# Patient Record
Sex: Female | Born: 1980
Health system: Southern US, Community
[De-identification: ages and names within clinical notes are randomized; demographics above are authoritative.]

## PROBLEM LIST (undated history)

## (undated) DIAGNOSIS — O139 Gestational [pregnancy-induced] hypertension without significant proteinuria, unspecified trimester: Secondary | ICD-10-CM

## (undated) HISTORY — DX: Gestational (pregnancy-induced) hypertension without significant proteinuria, unspecified trimester: O13.9

## (undated) HISTORY — PX: NO PAST SURGERIES: SHX2092

---

## 2005-01-14 DIAGNOSIS — O1490 Unspecified pre-eclampsia, unspecified trimester: Secondary | ICD-10-CM

## 2016-10-23 NOTE — L&D Delivery Note (Signed)
36 y.o. [id number] at 5780w3d admitted for IOL for pre-e with severe features, A1GDM delivered a viable female infant at 1810 in cephalic, LOA position. Loose nuchal cord, easily reduced at the perineum. Right anterior shoulder delivered with ease. 60 sec delayed cord clamping. Cord clamped x2 and cut. Placenta delivered spontaneously intact, with 3VC. Fundus firm on exam with massage and pitocin. Good hemostasis noted.  Anesthesia: Epidural Laceration: 2nd degree perineal Suture: 3.0 Vicryl Good hemostasis noted. EBL: 350 cc  Mom and baby recovering in LDR.    Apgars: APGAR (1 MIN):   APGAR (5 MINS):   Weight: Pending skin to skin  Sponge and instrument count were correct x2. Placenta sent to L&D.  [place] Shirley, DO FM Resident PGY-1 05/28/2017 6:41 PM  OB FELLOW DELIVERY ATTESTATION  I was gloved and present for the delivery in its entirety, and I agree with the above resident's note.    Frederik PearJulie P Alicen Donalson, MD OB Fellow 6:47 PM

## 2016-11-10 LAB — OB RESULTS CONSOLE HGB/HCT, BLOOD
HCT: 39
Hemoglobin: 12.6

## 2016-11-10 LAB — OB RESULTS CONSOLE HEPATITIS B SURFACE ANTIGEN: Hepatitis B Surface Ag: NEGATIVE

## 2016-11-10 LAB — OB RESULTS CONSOLE RPR: RPR: NONREACTIVE

## 2016-11-10 LAB — OB RESULTS CONSOLE GC/CHLAMYDIA
Chlamydia: NEGATIVE
Gonorrhea: NEGATIVE

## 2016-11-10 LAB — OB RESULTS CONSOLE ANTIBODY SCREEN: Antibody Screen: NEGATIVE

## 2016-11-10 LAB — OB RESULTS CONSOLE RUBELLA ANTIBODY, IGM: Rubella: IMMUNE

## 2016-11-10 LAB — OB RESULTS CONSOLE PLATELET COUNT: Platelets: 246

## 2016-11-10 LAB — OB RESULTS CONSOLE HIV ANTIBODY (ROUTINE TESTING): HIV: NONREACTIVE

## 2016-11-10 LAB — OB RESULTS CONSOLE ABO/RH: RH Type: POSITIVE

## 2016-12-14 LAB — OB RESULTS CONSOLE HGB/HCT, BLOOD
HEMATOCRIT: 40
HEMOGLOBIN: 13.4

## 2016-12-14 LAB — OB RESULTS CONSOLE PLATELET COUNT: Platelets: 195

## 2017-03-15 ENCOUNTER — Encounter: Payer: Self-pay | Admitting: *Deleted

## 2017-04-09 ENCOUNTER — Encounter: Payer: Self-pay | Admitting: Obstetrics & Gynecology

## 2017-04-09 ENCOUNTER — Ambulatory Visit (INDEPENDENT_AMBULATORY_CARE_PROVIDER_SITE_OTHER): Payer: Self-pay | Admitting: Obstetrics & Gynecology

## 2017-04-09 VITALS — BP 123/80 | HR 92 | Ht 69.0 in | Wt 248.8 lb

## 2017-04-09 DIAGNOSIS — O09293 Supervision of pregnancy with other poor reproductive or obstetric history, third trimester: Secondary | ICD-10-CM

## 2017-04-09 DIAGNOSIS — Z23 Encounter for immunization: Secondary | ICD-10-CM

## 2017-04-09 DIAGNOSIS — Z3480 Encounter for supervision of other normal pregnancy, unspecified trimester: Secondary | ICD-10-CM | POA: Insufficient documentation

## 2017-04-09 DIAGNOSIS — Z3483 Encounter for supervision of other normal pregnancy, third trimester: Secondary | ICD-10-CM

## 2017-04-09 NOTE — Patient Instructions (Signed)
Third Trimester of Pregnancy The third trimester is from week 28 through week 40 (months 7 through 9). The third trimester is a time when the unborn baby (fetus) is growing rapidly. At the end of the ninth month, the fetus is about 20 inches in length and weighs 6-10 pounds. Body changes during your third trimester Your body will continue to go through many changes during pregnancy. The changes vary from woman to woman. During the third trimester:  Your weight will continue to increase. You can expect to gain 25-35 pounds (11-16 kg) by the end of the pregnancy.  You may begin to get stretch marks on your hips, abdomen, and breasts.  You may urinate more often because the fetus is moving lower into your pelvis and pressing on your bladder.  You may develop or continue to have heartburn. This is caused by increased hormones that slow down muscles in the digestive tract.  You may develop or continue to have constipation because increased hormones slow digestion and cause the muscles that push waste through your intestines to relax.  You may develop hemorrhoids. These are swollen veins (varicose veins) in the rectum that can itch or be painful.  You may develop swollen, bulging veins (varicose veins) in your legs.  You may have increased body aches in the pelvis, back, or thighs. This is due to weight gain and increased hormones that are relaxing your joints.  You may have changes in your hair. These can include thickening of your hair, rapid growth, and changes in texture. Some women also have hair loss during or after pregnancy, or hair that feels dry or thin. Your hair will most likely return to normal after your baby is born.  Your breasts will continue to grow and they will continue to become tender. A yellow fluid (colostrum) may leak from your breasts. This is the first milk you are producing for your baby.  Your belly button may stick out.  You may notice more swelling in your hands,  face, or ankles.  You may have increased tingling or numbness in your hands, arms, and legs. The skin on your belly may also feel numb.  You may feel short of breath because of your expanding uterus.  You may have more problems sleeping. This can be caused by the size of your belly, increased need to urinate, and an increase in your body's metabolism.  You may notice the fetus "dropping," or moving lower in your abdomen (lightening).  You may have increased vaginal discharge.  You may notice your joints feel loose and you may have pain around your pelvic bone.  What to expect at prenatal visits You will have prenatal exams every 2 weeks until week 36. Then you will have weekly prenatal exams. During a routine prenatal visit:  You will be weighed to make sure you and the baby are growing normally.  Your blood pressure will be taken.  Your abdomen will be measured to track your baby's growth.  The fetal heartbeat will be listened to.  Any test results from the previous visit will be discussed.  You may have a cervical check near your due date to see if your cervix has softened or thinned (effaced).  You will be tested for Group B streptococcus. This happens between 35 and 37 weeks.  Your health care provider may ask you:  What your birth plan is.  How you are feeling.  If you are feeling the baby move.  If you have had   any abnormal symptoms, such as leaking fluid, bleeding, severe headaches, or abdominal cramping.  If you are using any tobacco products, including cigarettes, chewing tobacco, and electronic cigarettes.  If you have any questions.  Other tests or screenings that may be performed during your third trimester include:  Blood tests that check for low iron levels (anemia).  Fetal testing to check the health, activity level, and growth of the fetus. Testing is done if you have certain medical conditions or if there are problems during the  pregnancy.  Nonstress test (NST). This test checks the health of your baby to make sure there are no signs of problems, such as the baby not getting enough oxygen. During this test, a belt is placed around your belly. The baby is made to move, and its heart rate is monitored during movement.  What is false labor? False labor is a condition in which you feel small, irregular tightenings of the muscles in the womb (contractions) that usually go away with rest, changing position, or drinking water. These are called Braxton Hicks contractions. Contractions may last for hours, days, or even weeks before true labor sets in. If contractions come at regular intervals, become more frequent, increase in intensity, or become painful, you should see your health care provider. What are the signs of labor?  Abdominal cramps.  Regular contractions that start at 10 minutes apart and become stronger and more frequent with time.  Contractions that start on the top of the uterus and spread down to the lower abdomen and back.  Increased pelvic pressure and dull back pain.  A watery or bloody mucus discharge that comes from the vagina.  Leaking of amniotic fluid. This is also known as your "water breaking." It could be a slow trickle or a gush. Let your health care provider know if it has a color or strange odor. If you have any of these signs, call your health care provider right away, even if it is before your due date. Follow these instructions at home: Medicines  Follow your health care provider's instructions regarding medicine use. Specific medicines may be either safe or unsafe to take during pregnancy.  Take a prenatal vitamin that contains at least 600 micrograms (mcg) of folic acid.  If you develop constipation, try taking a stool softener if your health care provider approves. Eating and drinking  Eat a balanced diet that includes fresh fruits and vegetables, whole grains, good sources of protein  such as meat, eggs, or tofu, and low-fat dairy. Your health care provider will help you determine the amount of weight gain that is right for you.  Avoid raw meat and uncooked cheese. These carry germs that can cause birth defects in the baby.  If you have low calcium intake from food, talk to your health care provider about whether you should take a daily calcium supplement.  Eat four or five small meals rather than three large meals a day.  Limit foods that are high in fat and processed sugars, such as fried and sweet foods.  To prevent constipation: ? Drink enough fluid to keep your urine clear or pale yellow. ? Eat foods that are high in fiber, such as fresh fruits and vegetables, whole grains, and beans. Activity  Exercise only as directed by your health care provider. Most women can continue their usual exercise routine during pregnancy. Try to exercise for 30 minutes at least 5 days a week. Stop exercising if you experience uterine contractions.  Avoid heavy   lifting.  Do not exercise in extreme heat or humidity, or at high altitudes.  Wear low-heel, comfortable shoes.  Practice good posture.  You may continue to have sex unless your health care provider tells you otherwise. Relieving pain and discomfort  Take frequent breaks and rest with your legs elevated if you have leg cramps or low back pain.  Take warm sitz baths to soothe any pain or discomfort caused by hemorrhoids. Use hemorrhoid cream if your health care provider approves.  Wear a good support bra to prevent discomfort from breast tenderness.  If you develop varicose veins: ? Wear support pantyhose or compression stockings as told by your healthcare provider. ? Elevate your feet for 15 minutes, 3-4 times a day. Prenatal care  Write down your questions. Take them to your prenatal visits.  Keep all your prenatal visits as told by your health care provider. This is important. Safety  Wear your seat belt at  all times when driving.  Make a list of emergency phone numbers, including numbers for family, friends, the hospital, and police and fire departments. General instructions  Avoid cat litter boxes and soil used by cats. These carry germs that can cause birth defects in the baby. If you have a cat, ask someone to clean the litter box for you.  Do not travel far distances unless it is absolutely necessary and only with the approval of your health care provider.  Do not use hot tubs, steam rooms, or saunas.  Do not drink alcohol.  Do not use any products that contain nicotine or tobacco, such as cigarettes and e-cigarettes. If you need help quitting, ask your health care provider.  Do not use any medicinal herbs or unprescribed drugs. These chemicals affect the formation and growth of the baby.  Do not douche or use tampons or scented sanitary pads.  Do not cross your legs for long periods of time.  To prepare for the arrival of your baby: ? Take prenatal classes to understand, practice, and ask questions about labor and delivery. ? Make a trial run to the hospital. ? Visit the hospital and tour the maternity area. ? Arrange for maternity or paternity leave through employers. ? Arrange for family and friends to take care of pets while you are in the hospital. ? Purchase a rear-facing car seat and make sure you know how to install it in your car. ? Pack your hospital bag. ? Prepare the baby's nursery. Make sure to remove all pillows and stuffed animals from the baby's crib to prevent suffocation.  Visit your dentist if you have not gone during your pregnancy. Use a soft toothbrush to brush your teeth and be gentle when you floss. Contact a health care provider if:  You are unsure if you are in labor or if your water has broken.  You become dizzy.  You have mild pelvic cramps, pelvic pressure, or nagging pain in your abdominal area.  You have lower back pain.  You have persistent  nausea, vomiting, or diarrhea.  You have an unusual or bad smelling vaginal discharge.  You have pain when you urinate. Get help right away if:  Your water breaks before 37 weeks.  You have regular contractions less than 5 minutes apart before 37 weeks.  You have a fever.  You are leaking fluid from your vagina.  You have spotting or bleeding from your vagina.  You have severe abdominal pain or cramping.  You have rapid weight loss or weight gain.    You have shortness of breath with chest pain.  You notice sudden or extreme swelling of your face, hands, ankles, feet, or legs.  Your baby makes fewer than 10 movements in 2 hours.  You have severe headaches that do not go away when you take medicine.  You have vision changes. Summary  The third trimester is from week 28 through week 40, months 7 through 9. The third trimester is a time when the unborn baby (fetus) is growing rapidly.  During the third trimester, your discomfort may increase as you and your baby continue to gain weight. You may have abdominal, leg, and back pain, sleeping problems, and an increased need to urinate.  During the third trimester your breasts will keep growing and they will continue to become tender. A yellow fluid (colostrum) may leak from your breasts. This is the first milk you are producing for your baby.  False labor is a condition in which you feel small, irregular tightenings of the muscles in the womb (contractions) that eventually go away. These are called Braxton Hicks contractions. Contractions may last for hours, days, or even weeks before true labor sets in.  Signs of labor can include: abdominal cramps; regular contractions that start at 10 minutes apart and become stronger and more frequent with time; watery or bloody mucus discharge that comes from the vagina; increased pelvic pressure and dull back pain; and leaking of amniotic fluid. This information is not intended to replace advice  given to you by your health care provider. Make sure you discuss any questions you have with your health care provider. Document Released: 10/03/2001 Document Revised: 03/16/2016 Document Reviewed: 12/10/2012 Elsevier Interactive Patient Education  2017 [employer]  

## 2017-04-09 NOTE — Progress Notes (Signed)
  Subjective:    Lisa Johnston is a [id number] 7629w3d being seen today for her first obstetrical visit.  Her obstetrical history is significant for transfer care from [place]. Patient does intend to breast feed. Pregnancy history fully reviewed.  Patient reports no complaints.  Vitals:   04/09/17 1000 04/09/17 1002  BP: 123/80   Pulse: 92   Weight: 248 lb 12.8 oz (112.9 kg)   Height:  5\' 9"  (1.753 m)    HISTORY: OB History  Gravida Para Term Preterm AB Living  3 1   1 1 1   SAB TAB Ectopic Multiple Live Births    1     1    # Outcome Date GA Lbr Len/2nd Weight Sex Delivery Anes PTL Lv  3 Current           2 Preterm 01/14/05 6661w0d  6 lb 3 oz (2.807 kg) F Vag-Spont EPI N LIV     Complications: Preeclampsia  1 TAB 1996             Past Medical History:  Diagnosis Date  . Pregnancy induced hypertension   . Preterm labor    Past Surgical History:  Procedure Laterality Date  . NO PAST SURGERIES     Family History  Problem Relation Age of Onset  . Cancer Father   . Heart disease Father   . Diabetes Daughter   . Cancer Maternal Grandmother      Exam    Uterus:  Fundal Height: 31 cm  Pelvic Exam:            Vagina:  Not examined   pH:    Cervix:     Adnexa: not evaluated   Bony Pelvis:     System: Breast:  not examined   Skin: normal coloration and turgor, no rashes    Neurologic: oriented, normal mood   Extremities: normal strength, tone, and muscle mass   HEENT PERRLA   Mouth/Teeth dental hygiene good   Neck supple   Cardiovascular: regular rate and rhythm   Respiratory:  appears well, vitals normal, no respiratory distress, acyanotic, normal RR   Abdomen: gravid   Urinary:         Assessment:    Pregnancy: [id number] Patient Active Problem List   Diagnosis Date Noted  . Supervision of other normal pregnancy, antepartum 04/09/2017  . Hx of preeclampsia, prior pregnancy, currently pregnant, third trimester 04/09/2017        Plan:     Initial labs  drawn. Prenatal vitamins. Problem list reviewed and updated. Genetic Screening discussed {nl testing.  Ultrasound discussed; fetal survey: results reviewed.  Follow up in 2 weeks. 50% of 30 min visit spent on counseling and coordination of care.  Records abstracted   Scheryl DarterJames Arnold 04/09/2017

## 2017-04-11 ENCOUNTER — Other Ambulatory Visit: Payer: Self-pay

## 2017-04-11 DIAGNOSIS — O24419 Gestational diabetes mellitus in pregnancy, unspecified control: Secondary | ICD-10-CM

## 2017-04-11 DIAGNOSIS — Z3480 Encounter for supervision of other normal pregnancy, unspecified trimester: Secondary | ICD-10-CM

## 2017-04-11 DIAGNOSIS — O0993 Supervision of high risk pregnancy, unspecified, third trimester: Secondary | ICD-10-CM

## 2017-04-12 DIAGNOSIS — O24419 Gestational diabetes mellitus in pregnancy, unspecified control: Secondary | ICD-10-CM | POA: Insufficient documentation

## 2017-04-12 LAB — CBC
Hematocrit: 35.4 % (ref 34.0–46.6)
Hemoglobin: 12 g/dL (ref 11.1–15.9)
MCH: 31.7 pg (ref 26.6–33.0)
MCHC: 33.9 g/dL (ref 31.5–35.7)
MCV: 93 fL (ref 79–97)
PLATELETS: 143 10*3/uL — AB (ref 150–379)
RBC: 3.79 x10E6/uL (ref 3.77–5.28)
RDW: 14.2 % (ref 12.3–15.4)
WBC: 5.8 10*3/uL (ref 3.4–10.8)

## 2017-04-12 LAB — GLUCOSE TOLERANCE, 2 HOURS W/ 1HR
GLUCOSE, 1 HOUR: 206 mg/dL — AB (ref 65–179)
GLUCOSE, 2 HOUR: 108 mg/dL (ref 65–152)
GLUCOSE, FASTING: 97 mg/dL — AB (ref 65–91)

## 2017-04-12 LAB — HIV ANTIBODY (ROUTINE TESTING W REFLEX): HIV SCREEN 4TH GENERATION: NONREACTIVE

## 2017-04-12 LAB — RPR: RPR: NONREACTIVE

## 2017-04-16 ENCOUNTER — Encounter: Payer: Self-pay | Admitting: *Deleted

## 2017-04-16 ENCOUNTER — Encounter: Payer: Self-pay | Admitting: Obstetrics and Gynecology

## 2017-04-16 DIAGNOSIS — O09529 Supervision of elderly multigravida, unspecified trimester: Secondary | ICD-10-CM | POA: Insufficient documentation

## 2017-04-16 LAB — HM PAP SMEAR: Pap: NEGATIVE

## 2017-04-24 ENCOUNTER — Ambulatory Visit (INDEPENDENT_AMBULATORY_CARE_PROVIDER_SITE_OTHER): Payer: Medicaid Other | Admitting: Obstetrics and Gynecology

## 2017-04-24 VITALS — BP 133/84 | HR 98 | Wt 252.6 lb

## 2017-04-24 DIAGNOSIS — O09293 Supervision of pregnancy with other poor reproductive or obstetric history, third trimester: Secondary | ICD-10-CM

## 2017-04-24 DIAGNOSIS — O24419 Gestational diabetes mellitus in pregnancy, unspecified control: Secondary | ICD-10-CM

## 2017-04-24 DIAGNOSIS — O09523 Supervision of elderly multigravida, third trimester: Secondary | ICD-10-CM | POA: Diagnosis not present

## 2017-04-24 DIAGNOSIS — Z3480 Encounter for supervision of other normal pregnancy, unspecified trimester: Secondary | ICD-10-CM

## 2017-04-24 MED ORDER — ACCU-CHEK NANO SMARTVIEW W/DEVICE KIT: 1.0000 | PACK | 0 refills | Status: DC

## 2017-04-24 MED ORDER — ACCU-CHEK FASTCLIX LANCETS MISC: 1.0000 [IU] | Freq: Four times a day (QID) | 12 refills | Status: DC

## 2017-04-24 MED ORDER — GLUCOSE BLOOD VI STRP: ORAL_STRIP | 12 refills | Status: DC

## 2017-04-24 NOTE — Progress Notes (Signed)
   PRENATAL VISIT NOTE  Subjective:  Lisa Johnston is a 36 y.o. [id number] at 7w4dbeing seen today for ongoing prenatal care.  She is currently monitored for the following issues for this high-risk pregnancy and has Supervision of other normal pregnancy, antepartum; Hx of preeclampsia, prior pregnancy, currently pregnant, third trimester; Gestational diabetes; and AMA (advanced maternal age) multigravida 35+ on her problem list.  Patient reports no complaints.   .  .  Movement: Present. Denies leaking of fluid.   The following portions of the patient's history were reviewed and updated as appropriate: allergies, current medications, past family history, past medical history, past social history, past surgical history and problem list. Problem list updated.  Objective:   Vitals:   04/24/17 1354  BP: 133/84  Pulse: 98  Weight: 252 lb 9.6 oz (114.6 kg)    Fetal Status: Fetal Heart Rate (bpm): 154 Fundal Height: 32 cm Movement: Present     General:  Alert, oriented and cooperative. Patient is in no acute distress.  Skin: Skin is warm and dry. No rash noted.   Cardiovascular: Normal heart rate noted  Respiratory: Normal respiratory effort, no problems with respiration noted  Abdomen: Soft, gravid, appropriate for gestational age.       Pelvic:  Cervical exam deferred        Extremities: Normal range of motion.  Edema: Trace  Mental Status: Normal mood and affect. Normal behavior. Normal judgment and thought content.   Assessment and Plan:  Pregnancy: [id number] 336w4d1. Supervision of other normal pregnancy, antepartum Patient is doing well without complaints  2. Hx of preeclampsia, prior pregnancy, currently pregnant, third trimester Continue ASA  3. Elderly multigravida in third trimester   4. Gestational diabetes mellitus (GDM) in third trimester, gestational diabetes method of control unspecified Patient has not met with diabetic educator yet Patient has a diabetic daughter  and knows how to test her CBG. She will keep a log while waiting for her education session Informed the patient of the importance of keeping euglycemia during pregnancy  Preterm labor symptoms and general obstetric precautions including but not limited to vaginal bleeding, contractions, leaking of fluid and fetal movement were reviewed in detail with the patient. Please refer to After Visit Summary for other counseling recommendations.  Return in about 2 weeks (around 05/08/2017) for ROMontrose  PeMora BellmanMD

## 2017-05-03 ENCOUNTER — Ambulatory Visit: Payer: Medicaid Other | Admitting: *Deleted

## 2017-05-03 ENCOUNTER — Encounter: Payer: Medicaid Other | Attending: Obstetrics and Gynecology | Admitting: *Deleted

## 2017-05-03 DIAGNOSIS — O24410 Gestational diabetes mellitus in pregnancy, diet controlled: Secondary | ICD-10-CM | POA: Insufficient documentation

## 2017-05-03 DIAGNOSIS — Z713 Dietary counseling and surveillance: Secondary | ICD-10-CM | POA: Diagnosis not present

## 2017-05-03 DIAGNOSIS — Z3A00 Weeks of gestation of pregnancy not specified: Secondary | ICD-10-CM | POA: Insufficient documentation

## 2017-05-03 NOTE — Progress Notes (Addendum)
  Patient was seen on 05/03/2017 for Gestational Diabetes self-management. She is here with her daughter, [place], who has Type 1 Diabetes. She was using her daughter's back up meter for the first couple of days after diagnosis but has her own [employer] Nano meter now. She brought in her BG log so far. Most BG's are within target ranges with some FBG above target. The following learning objectives were met by the patient :   States the definition of Gestational Diabetes  States why dietary management is important in controlling blood glucose  Describes the effects of carbohydrates on blood glucose levels  Demonstrates ability to create a balanced meal plan  Demonstrates carbohydrate counting   States when to check blood glucose levels  Demonstrates proper blood glucose monitoring techniques  States the effect of stress and exercise on blood glucose levels  States the importance of limiting caffeine and abstaining from alcohol and smoking  Plan:  Aim for 3 Carb Choices per meal (45 grams) +/- 1 either way  Aim for 1-2 Carbs per snack Begin reading food labels for Total Carbohydrate of foods Consider  increasing your activity level by walking or other activity daily as tolerated Begin checking BG before breakfast and 2 hours after first bite of breakfast, lunch and dinner as directed by MD  Bring Log Book to every medical appointment   Take medication if directed by MD  Patient informed of [employer]s and agreed to sign up. I educated her to enter her BG at time of test, not to wait to the end of the day. She expressed understanding of info taught.   Patient already has a meter: [employer] Nano And is testing pre breakfast and 2 hours each meal as directed by MD Review of Log Book shows: most BG within target ranges with some FBG in the low 100's  Patient instructed to monitor glucose levels: FBS: 60 - 95 mg/dl 2 hour: <120 mg/dl  Patient received the following  handouts:  Nutrition Diabetes and Pregnancy  Carbohydrate Counting List  Patient will be seen for follow-up as needed.

## 2017-05-08 ENCOUNTER — Encounter: Payer: Self-pay | Admitting: Medical

## 2017-05-14 ENCOUNTER — Ambulatory Visit (INDEPENDENT_AMBULATORY_CARE_PROVIDER_SITE_OTHER): Payer: Medicaid Other | Admitting: Obstetrics and Gynecology

## 2017-05-14 ENCOUNTER — Other Ambulatory Visit (HOSPITAL_COMMUNITY)
Admission: RE | Admit: 2017-05-14 | Discharge: 2017-05-14 | Disposition: A | Discharge status: Home / Self Care | Payer: Medicaid Other | Source: Ambulatory Visit | Attending: Obstetrics and Gynecology | Admitting: Obstetrics and Gynecology

## 2017-05-14 VITALS — BP 129/89 | HR 87 | Wt 254.0 lb

## 2017-05-14 DIAGNOSIS — O09523 Supervision of elderly multigravida, third trimester: Secondary | ICD-10-CM | POA: Diagnosis not present

## 2017-05-14 DIAGNOSIS — O09293 Supervision of pregnancy with other poor reproductive or obstetric history, third trimester: Secondary | ICD-10-CM | POA: Insufficient documentation

## 2017-05-14 DIAGNOSIS — Z113 Encounter for screening for infections with a predominantly sexual mode of transmission: Secondary | ICD-10-CM

## 2017-05-14 DIAGNOSIS — O24410 Gestational diabetes mellitus in pregnancy, diet controlled: Secondary | ICD-10-CM | POA: Diagnosis not present

## 2017-05-14 DIAGNOSIS — Z3480 Encounter for supervision of other normal pregnancy, unspecified trimester: Secondary | ICD-10-CM

## 2017-05-14 LAB — OB RESULTS CONSOLE GBS: GBS: NEGATIVE

## 2017-05-14 NOTE — Patient Instructions (Addendum)
Places to have your son circumcised:    [facility] [phone] $480 by 4 wks  [facility] [phone] $244 by 4 wks  [facility] [phone] $175 by 2 wks  [facility] [phone] $250 by 7 days [facility] [phone] $150 by 4 wks  These prices sometimes change but are roughly what you can expect to pay. Please call and confirm pricing.   Circumcision is considered an elective/non-medically necessary procedure. There are many reasons parents decide to have their sons circumsized. During the first year of life circumcised males have a reduced risk of urinary tract infections but after this year the rates between circumcised males and uncircumcised males are the same.  It is safe to have your son circumcised outside of the hospital and the places above perform them regularly.   AREA PEDIATRIC/FAMILY PRACTICE PHYSICIANS  [facility] [address], [address] [place], [place]  [address] Phone - [phone]   Fax - [phone]  [facility] [address] [place], [place] [address] Phone - [phone]   Fax - [phone]  [facility] AM[facility] [address] [place], [place]  [address] Phone - [phone]   Fax - [phone]  [facility] [address] [place], [place]  [address] Phone - [phone]   Fax - [phone]  [facility] [address] [place], [place]  [address] Phone - [phone]   Fax - [phone]  [facility] [address], [address] [place], [place]  [address] Phone - [phone]   Fax - [phone]  [facility] [address], [address] [place], [place]  [address] Phone - [phone]   Fax - [phone]  [facility] [address] [place], [place]  [address] Phone - [phone]   Fax - [phone]  [facility] [address] [place], [place]  [address] Phone - [phone]   Fax - [phone] [facility] [address] [place], [place]  [address] Phone - [phone]   Fax - [phone]  [facility] [address] [place], [place]  [address] Phone - [phone]   Fax - [phone]  [facility] [address], [address] [place], [place]  [address] Phone - [phone]   Fax - [phone]  [facility] [address] [place], [place]  [address] Phone - [phone]   Fax - [phone]  [facility] [address], [address] [place], [place]  [address] Phone - [phone]  [facility] [address] [place], [place]  [address] Phone - [phone]   Fax - [phone]  [facility] [address], [address] [place], [place]  [address] Phone - [phone]   Fax - [phone]  [facility] [address] [place], [place]  [address] Phone - [phone]   Fax - [phone]  [facility] [address] [place], [place]  [address] Phone - [phone]   Fax - [phone]   [facility] [address] [place], [place]  [address] Phone - [phone]   Fax - [phone]  [facility] [address], [address] [place], [place]  [address] Phone - [phone]   Fax - [phone]  DAVID RUBIN [address], [address] [place], [place]  [address] Phone - [phone]   Fax - [phone]  [facility] [address] [place], [place]  [address] Phone - [phone]   Fax - [phone]  [facility] [address] [place], [place]  [address] Phone - [phone]   Fax - [phone] [place] [facility] [address] [place], [place]  [address] Phone - [phone]   Fax - [phone]  [place] [facility] [address] [place], [place]  [address] Phone - [phone]   Fax - [phone]  [facility] [place] [address] [place], [place]  [address] Phone - [phone]   Fax - [phone]  [facility] [place] Charlene Flemming MD [address] [place] [address] Phone [phone]  Fax [phone]   

## 2017-05-14 NOTE — Progress Notes (Signed)
Subjective:  Tandy Gawutumn Negro is a 36 y.o. [id number] at 481w3d being seen today for ongoing prenatal care.  She is currently monitored for the following issues for this high-risk pregnancy and has Supervision of other normal pregnancy, antepartum; Hx of preeclampsia, prior pregnancy, currently pregnant, third trimester; Gestational diabetes; and AMA (advanced maternal age) multigravida 35+ on her problem list.  Patient reports occasional contractions.  Contractions: Irregular. Vag. Bleeding: None.  Movement: Present. Denies leaking of fluid.   The following portions of the patient's history were reviewed and updated as appropriate: allergies, current medications, past family history, past medical history, past social history, past surgical history and problem list. Problem list updated.  Objective:   Vitals:   05/14/17 1500  BP: 129/89  Pulse: 87  Weight: 254 lb (115.2 kg)    Fetal Status: Fetal Heart Rate (bpm): 128   Movement: Present     General:  Alert, oriented and cooperative. Patient is in no acute distress.  Skin: Skin is warm and dry. No rash noted.   Cardiovascular: Normal heart rate noted  Respiratory: Normal respiratory effort, no problems with respiration noted  Abdomen: Soft, gravid, appropriate for gestational age. Pain/Pressure: Present     Pelvic:  Cervical exam performed        Extremities: Normal range of motion.  Edema: Trace  Mental Status: Normal mood and affect. Normal behavior. Normal judgment and thought content.   Urinalysis:      Assessment and Plan:  Pregnancy: [id number] at 101w3d  1. Elderly multigravida in third trimester Labor precautions - Culture, beta strep (group b only) - GC/Chlamydia probe amp ([place])not at [facility]  2. Supervision of other normal pregnancy, antepartum Labor precautions - Culture, beta strep (group b only) - GC/Chlamydia probe amp ([place])not at [facility]  3. Hx of preeclampsia, prior pregnancy, currently pregnant, third  trimester Continue with BSAS - Culture, beta strep (group b only) - GC/Chlamydia probe amp ([place])not at [facility]  4. Diet controlled gestational diabetes mellitus (GDM) in third trimester Half og BS in goal range Reviewed with pt, appears to be diet choices Pt encouraged to follow diet. If does not improve will need to start Diabeta - Culture, beta strep (group b only) - GC/Chlamydia probe amp ([place])not at [facility] - US MFM OB DETAIL +14 WK; Future  Preterm labor symptoms and general obstetric precautions including but not limited to vaginal bleeding, contractions, leaking of fluid and fetal movement were reviewed in detail with the patient. Please refer to After Visit Summary for other counseling recommendations.  Return in about 1 week (around 05/21/2017) for OB visit.   Hermina StaggersErvin, Angell Honse L, MD

## 2017-05-15 LAB — GC/CHLAMYDIA PROBE AMP (~~LOC~~) NOT AT ARMC
Chlamydia: NEGATIVE
NEISSERIA GONORRHEA: NEGATIVE

## 2017-05-16 ENCOUNTER — Encounter (INDEPENDENT_AMBULATORY_CARE_PROVIDER_SITE_OTHER): Payer: Self-pay

## 2017-05-18 ENCOUNTER — Encounter (HOSPITAL_COMMUNITY): Payer: Self-pay

## 2017-05-18 ENCOUNTER — Ambulatory Visit (HOSPITAL_COMMUNITY)
Admission: RE | Admit: 2017-05-18 | Discharge: 2017-05-18 | Disposition: A | Discharge status: Home / Self Care | Payer: Medicaid Other | Source: Ambulatory Visit | Attending: Obstetrics and Gynecology | Admitting: Obstetrics and Gynecology

## 2017-05-18 ENCOUNTER — Other Ambulatory Visit: Payer: Self-pay | Admitting: Obstetrics and Gynecology

## 2017-05-18 DIAGNOSIS — Z3A36 36 weeks gestation of pregnancy: Secondary | ICD-10-CM | POA: Diagnosis not present

## 2017-05-18 DIAGNOSIS — O24410 Gestational diabetes mellitus in pregnancy, diet controlled: Secondary | ICD-10-CM | POA: Diagnosis not present

## 2017-05-18 DIAGNOSIS — O09293 Supervision of pregnancy with other poor reproductive or obstetric history, third trimester: Secondary | ICD-10-CM

## 2017-05-18 DIAGNOSIS — O99213 Obesity complicating pregnancy, third trimester: Secondary | ICD-10-CM

## 2017-05-18 DIAGNOSIS — O09523 Supervision of elderly multigravida, third trimester: Secondary | ICD-10-CM | POA: Insufficient documentation

## 2017-05-18 DIAGNOSIS — Z3689 Encounter for other specified antenatal screening: Secondary | ICD-10-CM

## 2017-05-18 LAB — CULTURE, BETA STREP (GROUP B ONLY): Strep Gp B Culture: NEGATIVE

## 2017-05-21 ENCOUNTER — Ambulatory Visit: Payer: Medicaid Other | Admitting: Adult Health

## 2017-05-21 ENCOUNTER — Encounter: Payer: Self-pay | Admitting: Advanced Practice Midwife

## 2017-05-21 ENCOUNTER — Ambulatory Visit (INDEPENDENT_AMBULATORY_CARE_PROVIDER_SITE_OTHER): Payer: Medicaid Other | Admitting: Advanced Practice Midwife

## 2017-05-21 VITALS — BP 120/67 | HR 84 | Wt 258.0 lb

## 2017-05-21 DIAGNOSIS — R946 Abnormal results of thyroid function studies: Secondary | ICD-10-CM

## 2017-05-21 DIAGNOSIS — Z3480 Encounter for supervision of other normal pregnancy, unspecified trimester: Secondary | ICD-10-CM

## 2017-05-21 DIAGNOSIS — Z3483 Encounter for supervision of other normal pregnancy, third trimester: Secondary | ICD-10-CM | POA: Diagnosis not present

## 2017-05-21 LAB — POCT URINALYSIS DIP (DEVICE)
Bilirubin Urine: NEGATIVE
GLUCOSE, UA: NEGATIVE mg/dL
HGB URINE DIPSTICK: NEGATIVE
Ketones, ur: NEGATIVE mg/dL
NITRITE: NEGATIVE
PROTEIN: NEGATIVE mg/dL
Specific Gravity, Urine: 1.02 (ref 1.005–1.030)
UROBILINOGEN UA: 0.2 mg/dL (ref 0.0–1.0)
pH: 6.5 (ref 5.0–8.0)

## 2017-05-21 NOTE — Progress Notes (Signed)
Patient was advised by her daughter's endocrinologist that she needs a tsh, free t3, t4.

## 2017-05-21 NOTE — Patient Instructions (Signed)
Labor Precautions Reasons to come to MAU:  1.  Contractions are  5 minutes apart or less, each last 1 minute, these have been going on for 1-2 hours, and you cannot walk or talk during them 2.  You have a large gush of fluid, or a trickle of fluid that will not stop and you have to wear a pad 3.  You have bleeding that is bright red, heavier than spotting--like menstrual bleeding (spotting can be normal in early labor or after a check of your cervix) 4.  You do not feel the baby moving like he/she normally does  Goiter A goiter is an enlarged thyroid gland. The thyroid gland is located in the lower front of the neck. The gland produces hormones that regulate mood, body temperature, pulse rate, and digestion. Most goiters are painless and are not a cause for serious concern. Goiters and conditions that cause goiters can be treated, if necessary. What are the causes? Causes of this condition include:  Diseases that attack healthy cells in your body (autoimmune diseases) and affect your thyroid function, such as: ? Graves disease. This causes too much thyroid hormone to be produced and it makes your thyroid overly active (hyperthyroidism). ? Hashimoto disease. This type of inflammation of the thyroid (thyroiditis) causes too little thyroid hormone to be produced and it makes your thyroid not active enough (hypothyroidism).  Other conditions that cause thyroiditis.  Nodular goiter. This means that there are one or more small growths on your thyroid. These can create too much thyroid hormone.  Pregnancy.  Thyroid cancer. This is rare.  Certain medicines.  Radiation exposure.  Iodine deficiency.  In some cases, the cause may not be known (idiopathic). What increases the risk? This condition is more likely to develop in:  People who have a family history of goiter.  Women.  People who do not get enough iodine in their diet.  People who are older than 40.  People who smoke  tobacco.  What are the signs or symptoms? Common symptoms of this condition include:  Swelling in the lower part of the neck. This swelling can range from a very small bump to a large lump.  A tight feeling in the throat.  A hoarse voice.  Other symptoms include:  Coughing.  Wheezing.  Difficulty swallowing.  Difficulty breathing.  Bulging neck veins.  Dizziness.  In some cases, there are no symptoms and thyroid hormone levels may be normal. When a goiter is the result of hyperthyroidism, symptoms may also include:  Nervousness or restlessness.  Inability to tolerate heat.  Unexplained weight loss.  Diarrhea.  Change in the texture of hair or skin.  Changes in heart beat, such as skipped beats, extra beats, or a rapid heart rate.  Loss of menstruation.  Shaky hands.  Increased appetite.  Sleep problems.  When a goiter is the result of hypothyroidism, symptoms may also include:  Feeling like you have no energy (lethargy).  Inability to tolerate cold.  Weight gain that is not explained by a change in diet or exercise habits.  Dry skin.  Coarse hair.  Menstrual irregularity.  Constipation.  Sadness or depression.  How is this diagnosed? This condition may be diagnosed with a medical history and physical exam. You may also have other tests, including:  Blood tests to check thyroid function.  Imaging tests, such as: ? Ultrasonography. ? CT scan. ? MRI. ? Thyroid scan. You will be given a safe radioactive injection, then images will be  taken of your thyroid.  Tissue sample (biopsy) of the goiter or any nodules. This checks to see if the goiter or nodules are cancerous.  How is this treated? Treatment for this condition depends on the cause. Treatment may include:  Medicines to control your thyroid.  Anti-inflammatory or steroid medicines, if inflammation is the cause.  Iodine supplements or changes in diet, if the goiter is caused by  iodine deficiency.  Radiation therapy.  Surgery to remove your thyroid.  In some cases, no treatment is necessary, and your health care provider will monitor your condition at regular checkups. Follow these instructions at home:  Follow recommendations from your health care provider for any changes to your diet.  Take over-the-counter and prescription medicines only as told by your health care provider.  Do not use any tobacco products, including cigarettes, chewing tobacco, or e-cigarettes. If you need help quitting, ask your health care provider.  Keep all follow-up appointments as told by your health care provider. This is important. Contact a health care provider if:  Your symptoms do not get better with treatment. Get help right away if:  You develop sudden, unexplained confusion or other mental changes.  You have nausea, vomiting, or diarrhea.  You develop a fever.  Your skin or the whites of your eyes appear yellow (jaundice).  You develop chest pain.  You have trouble breathing or swallowing.  You suddenly become very weak.  You experience extreme restlessness. This information is not intended to replace advice given to you by your health care provider. Make sure you discuss any questions you have with your health care provider. Document Released: 03/29/2010 Document Revised: 04/28/2016 Document Reviewed: 10/05/2014 Elsevier Interactive Patient Education  [employer]2018 Elsevier Inc.

## 2017-05-21 NOTE — Progress Notes (Signed)
   PRENATAL VISIT NOTE  Subjective:  Lisa Johnston is a 36 y.o. [id number] at 3730w3d being seen today for ongoing prenatal care.  She is currently monitored for the following issues for this low-risk pregnancy and has Supervision of other normal pregnancy, antepartum; Hx of preeclampsia, prior pregnancy, currently pregnant, third trimester; Gestational diabetes; and AMA (advanced maternal age) multigravida 35+ on her problem list.  Patient reports no complaints.  Contractions: Irritability. Vag. Bleeding: None.  Movement: Present. Denies leaking of fluid.   The following portions of the patient's history were reviewed and updated as appropriate: allergies, current medications, past family history, past medical history, past social history, past surgical history and problem list. Problem list updated.  Objective:   Vitals:   05/21/17 1517  BP: 120/67  Pulse: 84  Weight: 258 lb (117 kg)    Fetal Status: Fetal Heart Rate (bpm): 150 Fundal Height: 36 cm Movement: Present     General:  Alert, oriented and cooperative. Patient is in no acute distress.  Skin: Skin is warm and dry. No rash noted.   Cardiovascular: Normal heart rate noted  Respiratory: Normal respiratory effort, no problems with respiration noted  Abdomen: Soft, gravid, appropriate for gestational age.  Pain/Pressure: Present     Pelvic: Cervical exam deferred        Extremities: Normal range of motion.  Edema: None  Mental Status:  Normal mood and affect. Normal behavior. Normal judgment and thought content.   Assessment and Plan:  Pregnancy: [id number] at 4130w3d  1. Supervision of other normal pregnancy, antepartum --Anticipatory guidance about next weeks of pregnancy, upcoming visits - POCT urinalysis dip (device) --No glucose log but discussed values with pt and fastings all in normal range, PP affected by diet so 3-4 in 15 above 120s. Discussed dietary changes, recommend walking/light exercise daily.  2. Abnormal thyroid  exam --Pt 36 yo daughter was at endocrinologist this week and was concerned about pts neck, possible goiter.  Asked her to have thyroid labs asap. Pt reports her neck is unchanged and has always had enlarged area since childhood.   --On physical exam, area of soft fatty tissue in mid/lower neck but no palpable mass or enlargement of thyroid. --Difficult to assess symptoms due to pregnancy but pt denies any additional fatigue, difficulty sleeping beyond normal pregnancy and denies any palpitations, shortness of breath, chest pain. - TSH - T3, free - T4, free  Term labor symptoms and general obstetric precautions including but not limited to vaginal bleeding, contractions, leaking of fluid and fetal movement were reviewed in detail with the patient. Please refer to After Visit Summary for other counseling recommendations.  No Follow-up on file.   Sharen CounterLisa Leftwich-Kirby, CNM

## 2017-05-22 LAB — TSH: TSH: 1.23 u[IU]/mL (ref 0.450–4.500)

## 2017-05-22 LAB — T4, FREE: Free T4: 0.96 ng/dL (ref 0.82–1.77)

## 2017-05-22 LAB — T3, FREE: T3, Free: 2.9 pg/mL (ref 2.0–4.4)

## 2017-05-27 ENCOUNTER — Encounter (HOSPITAL_COMMUNITY): Payer: Self-pay

## 2017-05-27 ENCOUNTER — Encounter (HOSPITAL_COMMUNITY): Payer: Self-pay | Admitting: Anesthesiology

## 2017-05-27 ENCOUNTER — Inpatient Hospital Stay (HOSPITAL_COMMUNITY)
Admission: AD | Admit: 2017-05-27 | Discharge: 2017-05-30 | DRG: 775 | Disposition: A | Discharge status: Home / Self Care | Payer: Medicaid Other | Source: Ambulatory Visit | Attending: Obstetrics & Gynecology | Admitting: Obstetrics & Gynecology

## 2017-05-27 DIAGNOSIS — Z3A37 37 weeks gestation of pregnancy: Secondary | ICD-10-CM | POA: Diagnosis not present

## 2017-05-27 DIAGNOSIS — O24419 Gestational diabetes mellitus in pregnancy, unspecified control: Secondary | ICD-10-CM | POA: Diagnosis present

## 2017-05-27 DIAGNOSIS — O1414 Severe pre-eclampsia complicating childbirth: Secondary | ICD-10-CM | POA: Diagnosis present

## 2017-05-27 DIAGNOSIS — Z3480 Encounter for supervision of other normal pregnancy, unspecified trimester: Secondary | ICD-10-CM

## 2017-05-27 DIAGNOSIS — Z7982 Long term (current) use of aspirin: Secondary | ICD-10-CM

## 2017-05-27 DIAGNOSIS — O09529 Supervision of elderly multigravida, unspecified trimester: Secondary | ICD-10-CM

## 2017-05-27 DIAGNOSIS — O24429 Gestational diabetes mellitus in childbirth, unspecified control: Secondary | ICD-10-CM | POA: Diagnosis not present

## 2017-05-27 DIAGNOSIS — O24420 Gestational diabetes mellitus in childbirth, diet controlled: Secondary | ICD-10-CM | POA: Diagnosis present

## 2017-05-27 DIAGNOSIS — R51 Headache: Secondary | ICD-10-CM | POA: Diagnosis present

## 2017-05-27 DIAGNOSIS — O1410 Severe pre-eclampsia, unspecified trimester: Secondary | ICD-10-CM | POA: Diagnosis present

## 2017-05-27 LAB — ABO/RH: ABO/RH(D): A POS

## 2017-05-27 LAB — TYPE AND SCREEN
ABO/RH(D): A POS
Antibody Screen: NEGATIVE

## 2017-05-27 LAB — PROTEIN / CREATININE RATIO, URINE: Creatinine, Urine: 41 mg/dL

## 2017-05-27 LAB — COMPREHENSIVE METABOLIC PANEL
ALT: 13 U/L — AB (ref 14–54)
AST: 18 U/L (ref 15–41)
Albumin: 3.1 g/dL — ABNORMAL LOW (ref 3.5–5.0)
Alkaline Phosphatase: 79 U/L (ref 38–126)
Anion gap: 8 (ref 5–15)
BUN: 6 mg/dL (ref 6–20)
CHLORIDE: 110 mmol/L (ref 101–111)
CO2: 19 mmol/L — AB (ref 22–32)
CREATININE: 0.63 mg/dL (ref 0.44–1.00)
Calcium: 8.6 mg/dL — ABNORMAL LOW (ref 8.9–10.3)
GFR calc non Af Amer: >60 mL/min (ref >60)
Glucose, Bld: 146 mg/dL — ABNORMAL HIGH (ref 65–99)
POTASSIUM: 3.4 mmol/L — AB (ref 3.5–5.1)
SODIUM: 137 mmol/L (ref 135–145)
Total Bilirubin: 0.3 mg/dL (ref 0.3–1.2)
Total Protein: 6.2 g/dL — ABNORMAL LOW (ref 6.5–8.1)

## 2017-05-27 LAB — URINALYSIS, ROUTINE W REFLEX MICROSCOPIC
BILIRUBIN URINE: NEGATIVE
Glucose, UA: 150 mg/dL — AB
HGB URINE DIPSTICK: NEGATIVE
KETONES UR: NEGATIVE mg/dL
NITRITE: NEGATIVE
PH: 7 (ref 5.0–8.0)
PROTEIN: NEGATIVE mg/dL
Specific Gravity, Urine: 1.006 (ref 1.005–1.030)

## 2017-05-27 LAB — GLUCOSE, CAPILLARY
GLUCOSE-CAPILLARY: 88 mg/dL (ref 65–99)
Glucose-Capillary: 79 mg/dL (ref 65–99)
Glucose-Capillary: 108 mg/dL — ABNORMAL HIGH (ref 65–99)

## 2017-05-27 LAB — CBC
HCT: 34.1 % — ABNORMAL LOW (ref 36.0–46.0)
Hemoglobin: 11.6 g/dL — ABNORMAL LOW (ref 12.0–15.0)
MCH: 31.4 pg (ref 26.0–34.0)
MCHC: 34 g/dL (ref 30.0–36.0)
MCV: 92.4 fL (ref 78.0–100.0)
PLATELETS: 138 10*3/uL — AB (ref 150–400)
RBC: 3.69 MIL/uL — AB (ref 3.87–5.11)
RDW: 13.4 % (ref 11.5–15.5)
WBC: 6.6 10*3/uL (ref 4.0–10.5)

## 2017-05-27 MED ORDER — MISOPROSTOL 25 MCG QUARTER TABLET
25.0000 ug | ORAL_TABLET | ORAL | Status: DC
  Administered 2017-05-27 (×2): 25 ug via BUCCAL
  Filled 2017-05-27 (×6): qty 1

## 2017-05-27 MED ORDER — ONDANSETRON 8 MG PO TBDP
8.0000 mg | ORAL_TABLET | Freq: Once | ORAL | Status: AC
  Administered 2017-05-27: 8 mg via ORAL
  Filled 2017-05-27: qty 1

## 2017-05-27 MED ORDER — BUTALBITAL-APAP-CAFFEINE 50-325-40 MG PO TABS
2.0000 | ORAL_TABLET | Freq: Once | ORAL | Status: AC
  Administered 2017-05-27: 2 via ORAL
  Filled 2017-05-27: qty 2

## 2017-05-27 MED ORDER — OXYTOCIN 40 UNITS IN LACTATED RINGERS INFUSION - SIMPLE MED
2.5000 [IU]/h | INTRAVENOUS | Status: DC
  Filled 2017-05-27: qty 1000

## 2017-05-27 MED ORDER — MAGNESIUM SULFATE BOLUS VIA INFUSION
4.0000 g | Freq: Once | INTRAVENOUS | Status: AC
  Administered 2017-05-27: 4 g via INTRAVENOUS
  Filled 2017-05-27: qty 500

## 2017-05-27 MED ORDER — OXYTOCIN BOLUS FROM INFUSION
500.0000 mL | Freq: Once | INTRAVENOUS | Status: AC
  Administered 2017-05-28: 500 mL via INTRAVENOUS

## 2017-05-27 MED ORDER — OXYCODONE-ACETAMINOPHEN 5-325 MG PO TABS: 1.0000 | ORAL_TABLET | ORAL | Status: DC | PRN

## 2017-05-27 MED ORDER — LIDOCAINE HCL (PF) 1 % IJ SOLN
30.0000 mL | INTRAMUSCULAR | Status: DC | PRN
  Administered 2017-05-28: 30 mL via SUBCUTANEOUS
  Filled 2017-05-27 (×2): qty 30

## 2017-05-27 MED ORDER — MAGNESIUM SULFATE 40 G IN LACTATED RINGERS - SIMPLE
2.0000 g/h | INTRAVENOUS | Status: DC
  Administered 2017-05-28 – 2017-05-29 (×2): 2 g/h via INTRAVENOUS
  Filled 2017-05-27: qty 40
  Filled 2017-05-27: qty 500
  Filled 2017-05-27: qty 40

## 2017-05-27 MED ORDER — TERBUTALINE SULFATE 1 MG/ML IJ SOLN: 0.2500 mg | Freq: Once | INTRAMUSCULAR | Status: DC | PRN

## 2017-05-27 MED ORDER — ACETAMINOPHEN 325 MG PO TABS
650.0000 mg | ORAL_TABLET | ORAL | Status: DC | PRN
Start: 2017-05-27 — End: 2017-05-29
  Administered 2017-05-28: 650 mg via ORAL
  Filled 2017-05-27: qty 2

## 2017-05-27 MED ORDER — LACTATED RINGERS IV BOLUS (SEPSIS)
1000.0000 mL | Freq: Once | INTRAVENOUS | Status: AC
  Administered 2017-05-27: 1000 mL via INTRAVENOUS

## 2017-05-27 MED ORDER — OXYCODONE-ACETAMINOPHEN 5-325 MG PO TABS: 2.0000 | ORAL_TABLET | ORAL | Status: DC | PRN

## 2017-05-27 MED ORDER — OXYCODONE HCL ER 10 MG PO T12A: 10.0000 mg | EXTENDED_RELEASE_TABLET | Freq: Two times a day (BID) | ORAL | Status: DC

## 2017-05-27 MED ORDER — DIPHENHYDRAMINE HCL 25 MG PO CAPS: 50.0000 mg | ORAL_CAPSULE | Freq: Four times a day (QID) | ORAL | Status: DC | PRN

## 2017-05-27 MED ORDER — FLEET ENEMA 7-19 GM/118ML RE ENEM: 1.0000 | ENEMA | RECTAL | Status: DC | PRN

## 2017-05-27 MED ORDER — LACTATED RINGERS IV SOLN
INTRAVENOUS | Status: DC
  Administered 2017-05-27 – 2017-05-29 (×2): via INTRAVENOUS

## 2017-05-27 MED ORDER — SOD CITRATE-CITRIC ACID 500-334 MG/5ML PO SOLN: 30.0000 mL | ORAL | Status: DC | PRN

## 2017-05-27 MED ORDER — LACTATED RINGERS IV SOLN: 500.0000 mL | INTRAVENOUS | Status: DC | PRN

## 2017-05-27 MED ORDER — ONDANSETRON HCL 4 MG/2ML IJ SOLN
4.0000 mg | Freq: Four times a day (QID) | INTRAMUSCULAR | Status: DC | PRN
  Administered 2017-05-28: 4 mg via INTRAVENOUS
  Filled 2017-05-27: qty 2

## 2017-05-27 MED ORDER — OXYCODONE HCL 5 MG PO TABS
10.0000 mg | ORAL_TABLET | Freq: Once | ORAL | Status: AC
  Administered 2017-05-27: 10 mg via ORAL
  Filled 2017-05-27: qty 2

## 2017-05-27 NOTE — Anesthesia Pain Management Evaluation Note (Signed)
2 CRNA Pain Management Visit Note  Patient: Lisa GawAutumn Gambale, 36 y.o., female  "Hello I am a member of the anesthesia [facility] at [facility]. We have an anesthesia team available at all times to provide care throughout the hospital, including epidural management and anesthesia for C-section. I don't know your plan for the delivery whether it a natural birth, water birth, IV sedation, nitrous supplementation, doula or epidural, but we want to meet your pain goals."   1.Was your pain managed to your expectations on prior hospitalizations?   Yes   2.What is your expectation for pain management during this hospitalization?     Epidural  3.How can we help you reach that goal? Epidural   Record the patient's initial score and the patient's pain goal.   Pain: 0  Pain Goal: 5 [facility] wants you to be able to say your pain was always managed very well.  Raymie Giammarco 05/27/2017

## 2017-05-27 NOTE — H&P (Signed)
History    CSN: [id number]  Arrival date and time: 05/27/17 1311   First Provider Initiated Contact with Patient 05/27/17 1335     Chief Complaint  Patient presents with  . Headache  . Leg Swelling   HPI Lisa Johnston is a 36 y.o. [id number] at 51w2dwho presents with swelling and a headache. She states the swelling started suddenly last night (8/4) in her legs and face. This am she developed a headache that she rates a 6/10 that she took tylenol for with no relief. She denies any visual changes or epigastric pain. She denies leaking or bleeding, reports good fetal movement. She has a hx of preeclampsia with her first pregnancy and was delivered at 36 weeks. She has gestational diabetes that is diet controlled.    Clinic [facility] Prenatal Labs  Dating  Blood type: A/Positive/-- (01/19 0000)   Genetic Screen 1 Screen:    AFP:     Quad:     NIPS: Antibody:Negative (01/19 0000)  Anatomic [place] Rubella: Immune (01/19 0000)  GTT Early:               Third trimester: abnormal  RPR: Non Reactive (06/20 0856)   Flu vaccine  HBsAg: Negative (01/19 0000)   TDaP vaccine  07/07/19                                             Rhogam: HIV: Non-reactive (01/19 0000)   Baby Food  Breast                                             GDJM:EQASTMHD(07/23 0000)(For PCN allergy, check sensitivities) negative  Contraception  IUD PQQI:WLNLGX Circumcision Yes    Pediatrician uncertain   [place]   Prenatal Classes       OB History    Gravida Para Term Preterm AB Living   _0 SAB TAB Ectopic Multiple Live Births     1     1      Past Medical History:  Diagnosis Date  . Pregnancy induced hypertension   . Preterm labor     Past Surgical History:  Procedure Laterality Date  . NO PAST SURGERIES      Family History  Problem Relation Age of Onset  . Cancer Father   . Heart disease Father   . Diabetes Daughter   . Cancer Maternal Grandmother     Social History   Substance Use Topics  . Smoking status: Never Smoker  . Smokeless tobacco: Never Used  . Alcohol use Yes     Comment: occ glass of wine    Allergies: No Known Allergies  Prescriptions Prior to Admission  Medication Sig Dispense Refill Last Dose  . ACCU-CHEK FASTCLIX LANCETS MISC 1 Units by Percutaneous route 4 (four) times daily. 100 each 12 Taking  . aspirin EC 81 MG tablet Take 81 mg by mouth daily.   Taking  . Blood Glucose Monitoring Suppl (ACCU-CHEK NANO SMARTVIEW) w/Device KIT 1 kit by Subdermal route as directed. Check blood sugars for fasting, and two hours after breakfast, lunch and dinner (4 checks daily) 1 kit 0 Taking  . glucose blood (ACCU-CHEK SMARTVIEW)  test strip Use as instructed to check blood sugars 100 each 12 Taking  . Prenatal Vit-Fe Fumarate-FA (MULTIVITAMIN-PRENATAL) 27-0.8 MG TABS tablet Take 1 tablet by mouth daily at 12 noon.   Taking    Review of Systems  Constitutional: Negative.  Negative for chills and fever.  HENT: Negative.   Eyes: Negative for visual disturbance.  Respiratory: Negative.  Negative for shortness of breath.   Cardiovascular: Negative.  Negative for chest pain.  Gastrointestinal: Negative for abdominal pain, constipation, diarrhea and vomiting.  Genitourinary: Negative.  Negative for dysuria, vaginal bleeding and vaginal discharge.  Musculoskeletal:       Swelling  Neurological: Positive for headaches. Negative for dizziness.  Psychiatric/Behavioral: Negative.    Physical Exam   Blood pressure 126/66, pulse 89, temperature 98.1 F (36.7 C), temperature source Oral, resp. rate 16, height _0  (1.753 m), weight 259 lb (117.5 kg).  Patient Vitals for the past 24 hrs:  BP Temp Temp src Pulse Resp Height Weight  05/27/17 1445 128/86 - - 92 - - -  05/27/17 1430 123/85 - - 91 - - -  05/27/17 1415 126/66 - - 89 - - -  05/27/17 1404 128/82 - - 92 - - -  05/27/17 1329 133/87 98.1 F (36.7 C) Oral (!) 105 16 _1  (1.753 m) 259 lb  (117.5 kg)   Physical Exam  Nursing note and vitals reviewed. Constitutional: She is oriented to person, place, and time. She appears well-developed and well-nourished.  HENT:  Head: Normocephalic and atraumatic.  Eyes: Conjunctivae are normal. No scleral icterus.  Cardiovascular: Normal rate, regular rhythm and normal heart sounds.   Respiratory: Effort normal and breath sounds normal. No respiratory distress.  GI: Bowel sounds are normal. She exhibits no distension. There is no tenderness.  Musculoskeletal: She exhibits edema (bilaterally below knees).  Neurological: She is alert and oriented to person, place, and time. She has normal strength and normal reflexes. She displays normal reflexes. No cranial nerve deficit or sensory deficit.  Skin: Skin is warm and dry.  Psychiatric: She has a normal mood and affect. Her behavior is normal. Judgment and thought content normal.   Dilation: Closed Effacement (%): Thick Cervical Position: Posterior Presentation: Vertex (per scan by cnm) Exam by:: d lawson cnm  Fetal Tracing:  Baseline: 140 Variability: moderate Accelerations: 15x15 Decelerations: none  Toco:  none  MAU Course  Procedures Results for orders placed or performed during the hospital encounter of 05/27/17 (from the past 24 hour(s))  Urinalysis, Routine w reflex microscopic     Status: Abnormal   Collection Time: 05/27/17  1:15 PM  Result Value Ref Range   Color, Urine YELLOW YELLOW   APPearance CLEAR CLEAR   Specific Gravity, Urine 1.006 1.005 - 1.030   pH 7.0 5.0 - 8.0   Glucose, UA 150 (A) NEGATIVE mg/dL   Hgb urine dipstick NEGATIVE NEGATIVE   Bilirubin Urine NEGATIVE NEGATIVE   Ketones, ur NEGATIVE NEGATIVE mg/dL   Protein, ur NEGATIVE NEGATIVE mg/dL   Nitrite NEGATIVE NEGATIVE   Leukocytes, UA MODERATE (A) NEGATIVE   RBC / HPF 0-5 0 - 5 RBC/hpf   WBC, UA 0-5 0 - 5 WBC/hpf   Bacteria, UA FEW (A) NONE SEEN   Squamous Epithelial / LPF 0-5 (A) NONE SEEN   Protein / creatinine ratio, urine     Status: None   Collection Time: 05/27/17  1:15 PM  Result Value Ref Range   Creatinine, Urine 41.00 mg/dL   Total Protein,  Urine <6 mg/dL   Protein Creatinine Ratio        0.00 - 0.15 mg/mg[Cre]  CBC     Status: Abnormal   Collection Time: 05/27/17  2:00 PM  Result Value Ref Range   WBC 6.6 4.0 - 10.5 K/uL   RBC 3.69 (L) 3.87 - 5.11 MIL/uL   Hemoglobin 11.6 (L) 12.0 - 15.0 g/dL   HCT 34.1 (L) 36.0 - 46.0 %   MCV 92.4 78.0 - 100.0 fL   MCH 31.4 26.0 - 34.0 pg   MCHC 34.0 30.0 - 36.0 g/dL   RDW 13.4 11.5 - 15.5 %   Platelets 138 (L) 150 - 400 K/uL  Comprehensive metabolic panel     Status: Abnormal   Collection Time: 05/27/17  2:00 PM  Result Value Ref Range   Sodium 137 135 - 145 mmol/L   Potassium 3.4 (L) 3.5 - 5.1 mmol/L   Chloride 110 101 - 111 mmol/L   CO2 19 (L) 22 - 32 mmol/L   Glucose, Bld 146 (H) 65 - 99 mg/dL   BUN 6 6 - 20 mg/dL   Creatinine, Ser 0.63 0.44 - 1.00 mg/dL   Calcium 8.6 (L) 8.9 - 10.3 mg/dL   Total Protein 6.2 (L) 6.5 - 8.1 g/dL   Albumin 3.1 (L) 3.5 - 5.0 g/dL   AST 18 15 - 41 U/L   ALT 13 (L) 14 - 54 U/L   Alkaline Phosphatase 79 38 - 126 U/L   Total Bilirubin 0.3 0.3 - 1.2 mg/dL   GFR calc non Af Amer >60 >60 mL/min   GFR calc Af Amer >60 >60 mL/min   Anion gap 8 5 - 15   MDM UA CBC, CMP, protein/creat ratio IV LR bolus- FHR tachycardia in 170s, resolved after bolus 2 Fioricet PO- no relief Zofran 76m ODT Patient now reporting feeling like the room is spinning and having nausea Assessment and Plan  IUP at 37.2 weeks. Preeclampsia. A1GDM. GBS neg  Admit to birthing suites Mag 4g bolus and 2g/hr Cytotec for cervical ripening- foley when able Patient plans epidural for pain management MOF:  Breast  MOC: IUD Anticipate NSVD  CLen BlalockSNM 05/27/2017, 2:54 PM

## 2017-05-27 NOTE — Progress Notes (Signed)
Pt due for cervix check and placement of cytotec, food just arrived, will wait until patient finishes eating.

## 2017-05-27 NOTE — Progress Notes (Signed)
Labor Progress Note Lisa Johnston is a 36 y.o. [id number] at 251w2d presented for IOL for pre-E with S/F by HA S: Feeling regular contractions. Family at bedside.  O:  BP (!) 132/48   Pulse (!) 123   Temp 97.7 F (36.5 C) (Oral)   Resp 18   Ht 5\' 9"  (1.753 m)   Wt 259 lb (117.5 kg)   LMP  (LMP Unknown)   BMI 38.25 kg/m  EFM: 120/moderate/+accel/No decel  CVE: Dilation: Closed Effacement (%): 20 Cervical Position: Middle Station: Ballotable Presentation: Vertex Exam by:: Lytle MichaelsLauren McClure RN   A&P: 36 y.o. [id number] 591w2d IOL for preE with SF by headache #Labor: Still Closed cervix. Plan to continue ripening with cytotec. CRB when cervix open #Pain: Controlled with medications #FWB: Cat 1 #GBS negative #Pre E w/SF- Mild range pressures. On Mag. #GDMA1 Continue Q2 blood sugar checks #insominia- Plan benadryl   John Giovanniorey P Zamira Hickam, MD 9:58 PM

## 2017-05-27 NOTE — MAU Note (Signed)
Reports waking up swollen yesterday that persisted throughout the day. Woke up this morning still swollen and now has a headache. Took tylenol with no relief. Reports decreased fetal movement over the past week but thinks it is bc he is running out of space. Only problem with pregnancy is GDM. Has hx of preeclampsia.

## 2017-05-28 ENCOUNTER — Encounter: Payer: Medicaid Other | Admitting: Student

## 2017-05-28 ENCOUNTER — Encounter (HOSPITAL_COMMUNITY): Payer: Self-pay | Admitting: *Deleted

## 2017-05-28 ENCOUNTER — Inpatient Hospital Stay (HOSPITAL_COMMUNITY): Payer: Medicaid Other | Admitting: Anesthesiology

## 2017-05-28 ENCOUNTER — Telehealth: Payer: Self-pay | Admitting: *Deleted

## 2017-05-28 DIAGNOSIS — O1414 Severe pre-eclampsia complicating childbirth: Secondary | ICD-10-CM

## 2017-05-28 DIAGNOSIS — O24429 Gestational diabetes mellitus in childbirth, unspecified control: Secondary | ICD-10-CM

## 2017-05-28 DIAGNOSIS — Z3A37 37 weeks gestation of pregnancy: Secondary | ICD-10-CM

## 2017-05-28 LAB — CBC
HCT: 32.9 % — ABNORMAL LOW (ref 36.0–46.0)
HEMATOCRIT: 34.7 % — AB (ref 36.0–46.0)
HEMOGLOBIN: 11.3 g/dL — AB (ref 12.0–15.0)
HEMOGLOBIN: 11.6 g/dL — AB (ref 12.0–15.0)
MCH: 31.7 pg (ref 26.0–34.0)
MCH: 31.3 pg (ref 26.0–34.0)
MCHC: 34.3 g/dL (ref 30.0–36.0)
MCHC: 33.4 g/dL (ref 30.0–36.0)
MCV: 92.4 fL (ref 78.0–100.0)
MCV: 93.5 fL (ref 78.0–100.0)
Platelets: 135 10*3/uL — ABNORMAL LOW (ref 150–400)
Platelets: 137 10*3/uL — ABNORMAL LOW (ref 150–400)
RBC: 3.56 MIL/uL — AB (ref 3.87–5.11)
RBC: 3.71 MIL/uL — AB (ref 3.87–5.11)
RDW: 13.6 % (ref 11.5–15.5)
RDW: 13.4 % (ref 11.5–15.5)
WBC: 9.4 10*3/uL (ref 4.0–10.5)
WBC: 14 10*3/uL — ABNORMAL HIGH (ref 4.0–10.5)

## 2017-05-28 LAB — GLUCOSE, CAPILLARY
GLUCOSE-CAPILLARY: 94 mg/dL (ref 65–99)
GLUCOSE-CAPILLARY: 94 mg/dL (ref 65–99)
GLUCOSE-CAPILLARY: 108 mg/dL — AB (ref 65–99)
Glucose-Capillary: 74 mg/dL (ref 65–99)

## 2017-05-28 LAB — RPR: RPR: NONREACTIVE

## 2017-05-28 MED ORDER — EPHEDRINE 5 MG/ML INJ: 10.0000 mg | INTRAVENOUS | Status: DC | PRN

## 2017-05-28 MED ORDER — PHENYLEPHRINE 40 MCG/ML (10ML) SYRINGE FOR IV PUSH (FOR BLOOD PRESSURE SUPPORT)
80.0000 ug | PREFILLED_SYRINGE | INTRAVENOUS | Status: DC | PRN
  Filled 2017-05-28: qty 10
  Filled 2017-05-28: qty 5
  Filled 2017-05-28: qty 10

## 2017-05-28 MED ORDER — TERBUTALINE SULFATE 1 MG/ML IJ SOLN: 0.2500 mg | Freq: Once | INTRAMUSCULAR | Status: DC | PRN

## 2017-05-28 MED ORDER — DIPHENHYDRAMINE HCL 50 MG/ML IJ SOLN: 12.5000 mg | INTRAMUSCULAR | Status: DC | PRN

## 2017-05-28 MED ORDER — FENTANYL 2.5 MCG/ML BUPIVACAINE 1/10 % EPIDURAL INFUSION (WH - ANES)
14.0000 mL/h | INTRAMUSCULAR | Status: DC | PRN
  Administered 2017-05-28 (×2): 14 mL/h via EPIDURAL
  Filled 2017-05-28 (×2): qty 100

## 2017-05-28 MED ORDER — LACTATED RINGERS IV SOLN: 500.0000 mL | Freq: Once | INTRAVENOUS | Status: DC

## 2017-05-28 MED ORDER — LIDOCAINE HCL (PF) 1 % IJ SOLN
INTRAMUSCULAR | Status: DC | PRN
  Administered 2017-05-28 (×2): 6 mL via EPIDURAL

## 2017-05-28 MED ORDER — EPHEDRINE 5 MG/ML INJ
10.0000 mg | INTRAVENOUS | Status: DC | PRN
  Filled 2017-05-28: qty 2

## 2017-05-28 MED ORDER — PHENYLEPHRINE 40 MCG/ML (10ML) SYRINGE FOR IV PUSH (FOR BLOOD PRESSURE SUPPORT): 80.0000 ug | PREFILLED_SYRINGE | INTRAVENOUS | Status: DC | PRN

## 2017-05-28 MED ORDER — BUPIVACAINE HCL (PF) 0.25 % IJ SOLN
INTRAMUSCULAR | Status: DC | PRN
  Administered 2017-05-28: 10 mL via EPIDURAL

## 2017-05-28 MED ORDER — ZOLPIDEM TARTRATE 5 MG PO TABS
5.0000 mg | ORAL_TABLET | Freq: Once | ORAL | Status: DC
Start: 2017-05-28 — End: 2017-05-28

## 2017-05-28 MED ORDER — IBUPROFEN 600 MG PO TABS
600.0000 mg | ORAL_TABLET | Freq: Four times a day (QID) | ORAL | Status: DC
  Administered 2017-05-28 – 2017-05-30 (×7): 600 mg via ORAL
  Filled 2017-05-28 (×7): qty 1

## 2017-05-28 MED ORDER — OXYTOCIN 40 UNITS IN LACTATED RINGERS INFUSION - SIMPLE MED
1.0000 m[IU]/min | INTRAVENOUS | Status: DC
  Administered 2017-05-28: 1 m[IU]/min via INTRAVENOUS

## 2017-05-28 MED ORDER — OXYCODONE HCL 5 MG PO TABS
10.0000 mg | ORAL_TABLET | Freq: Once | ORAL | Status: AC
  Administered 2017-05-28: 10 mg via ORAL
  Filled 2017-05-28: qty 2

## 2017-05-28 MED ORDER — PHENYLEPHRINE 40 MCG/ML (10ML) SYRINGE FOR IV PUSH (FOR BLOOD PRESSURE SUPPORT)
80.0000 ug | PREFILLED_SYRINGE | INTRAVENOUS | Status: DC | PRN
  Administered 2017-05-28: 80 ug via INTRAVENOUS
  Filled 2017-05-28: qty 5

## 2017-05-28 NOTE — Progress Notes (Signed)
LABOR PROGRESS NOTE  Lisa Johnston is a 36 y.o. [id number] at 1930w3d  admitted for IOL for pre-eclampsia with severe features  Subjective: Doing well. Denies any concerns. Had headache earlier, now improved.  Objective: BP 126/68   Pulse 75   Temp 98.5 F (36.9 C) (Axillary)   Resp 16   Ht 5\' 9"  (1.753 m)   Wt 259 lb (117.5 kg)   LMP  (LMP Unknown)   SpO2 96%   BMI 38.25 kg/m  or  Vitals:   05/28/17 1200 05/28/17 1230 05/28/17 1259 05/28/17 1330  BP: 110/61 (!) 103/54 118/67 126/68  Pulse: 95 86 72 75  Resp: 16 16  16   Temp: 98.5 F (36.9 C)     TempSrc: Axillary     SpO2:      Weight:      Height:        SVE 5.5/70/-2 AROM, clear fluid FHT: baseline rate 120, moderate varibility, +acel, no decel Toco: q2-4 min  Labs: Lab Results  Component Value Date   WBC 9.4 05/28/2017   HGB 11.3 (L) 05/28/2017   HCT 32.9 (L) 05/28/2017   MCV 92.4 05/28/2017   PLT 135 (L) 05/28/2017    Assessment / Plan: 36 y.o. [id number] at 8430w3d here for pre-eclampsia with severe features. Pregnancy also complicated by A1GDM  Labor: On Pitocin, currently at 7319mu/min. AROM at 1415, clear fluid. Continue to titrate Pitocin PreE w/ SF: IV mag A1GDM: CBG at goal Fetal Wellbeing:  Cat I Pain Control:  Well-controlled with epidural Anticipated MOD:  SVD  Frederik PearJulie P Degele, MD 05/28/2017, 2:16 PM

## 2017-05-28 NOTE — Progress Notes (Signed)
Lisa Johnston is a 36 y.o. [id number] at 3960w3d by LMP admitted for induction of labor due to Gestational diabetes and Pre-eclamptic toxemia of pregnancy..  Subjective: Comfortable with epidural. No complaints or questions.   Objective: BP (!) 111/50   Pulse 76   Temp 98 F (36.7 C) (Oral)   Resp 18   Ht 5\' 9"  (1.753 m)   Wt 259 lb (117.5 kg)   LMP  (LMP Unknown)   SpO2 96%   BMI 38.25 kg/m  I/O last 3 completed shifts: In: 3723.8 [P.O.:1760; I.V.:1963.8] Out: 4450 [Urine:4450] No intake/output data recorded.  FHT:  FHR: 125 bpm, variability: moderate,  accelerations:  Present,  decelerations:  Absent UC:   regular, every 2-4 minutes SVE:   Dilation: 3 Effacement (%): 60 Station: Ballotable Exam by:: lauren mcclure rn  Labs: Lab Results  Component Value Date   WBC 9.4 05/28/2017   HGB 11.3 (L) 05/28/2017   HCT 32.9 (L) 05/28/2017   MCV 92.4 05/28/2017   PLT 135 (L) 05/28/2017    Assessment / Plan: Induction of labor due to preeclampsia and gestational diabetes,  progressing well on pitocin  Labor: Progressing normally Preeclampsia:  on magnesium sulfate Fetal Wellbeing:  Category I Pain Control:  Epidural I/D:  n/a Anticipated MOD:  NSVD  Thressa ShellerHeather Hogan 05/28/2017, 7:25 AM

## 2017-05-28 NOTE — Telephone Encounter (Signed)
-----   Message from Hurshel PartyLisa A Leftwich-Kirby, CNM sent at 05/24/2017  5:11 PM EDT ----- Regarding: Call pt with results This pt has normal thyroid labs (TSH, Free T3 and T4) but endocrinologist was concerned about goiter based on her appearance.  Please call her with lab results so she can notify endocrinology. Thank you.

## 2017-05-28 NOTE — Telephone Encounter (Signed)
I called Lisa Johnston per Lisa's message and left a message I am calling with some non- urgent information- please call our office.

## 2017-05-28 NOTE — Progress Notes (Signed)
Patient ID: Tandy GawAutumn Wollenberg, female   DOB: 10-28-1980, 36 y.o.   MRN: [id number] Labor Progress Note  S: Patient seen & examined for progress of labor. Patient comfortable with epidural.   O: BP (!) 99/52   Pulse 86   Temp 97.9 F (36.6 C) (Oral)   Resp 16   Ht 5\' 9"  (1.753 m)   Wt 117.5 kg (259 lb)   LMP  (LMP Unknown)   SpO2 96%   BMI 38.25 kg/m   FHT: 130bpm, mod var, +accels, no decels TOCO: q1-194min, patient looks comfortable during contractions  CVE: Dilation: 4.5 Effacement (%): 50 Cervical Position: Middle Station: Ballotable Presentation: Vertex Exam by:: Dr Talbert ForestShirley  A&P: 36 y.o. [id number] 6534w3d here for IOL for pre-e with severe features, A1GDM  CBGs all wnl; BP's all wnl Patient still on mag for severe features, started 08/05 at 1730 Currently on 12 milli-units of pitocin Continue current induction management Anticipate SVD  [place] Derionna Salvador, DO FM Resident PGY-1 05/28/2017 10:10 AM

## 2017-05-28 NOTE — Anesthesia Procedure Notes (Signed)
Epidural Patient location during procedure: OB Start time: 05/28/2017 4:00 AM End time: 05/28/2017 4:21 AM  Staffing Anesthesiologist: Jairo BenJACKSON, Akasha Melena Performed: anesthesiologist   Preanesthetic Checklist Completed: patient identified, surgical consent, pre-op evaluation, timeout performed, IV checked, risks and benefits discussed and monitors and equipment checked  Epidural Patient position: sitting Prep: site prepped and draped and DuraPrep Patient monitoring: blood pressure, continuous pulse ox and heart rate Approach: midline Location: L2-L3 Injection technique: LOR air  Needle:  Needle type: Tuohy  Needle gauge: 17 G Needle length: 9 cm Needle insertion depth: 6 cm Catheter type: closed end flexible Catheter size: 19 Gauge Catheter at skin depth: 12 cm Test dose: negative (1% lidocaine)  Assessment Events: blood not aspirated, injection not painful, no injection resistance, negative IV test and no paresthesia  Additional Notes Pt identified in Labor room.  Monitors applied. Working IV access confirmed. Sterile prep, drape lumbar spine.  1% lido local L 2,3.  #17ga Touhy LOR air at 6 cm L 2,3, cath in easily to 12 cm skin. Test dose OK, cath dosed and infusion begun.  Patient asymptomatic, VSS, no heme aspirated, tolerated well.  Sandford Craze Ulyssa Walthour, MDReason for block:procedure for pain

## 2017-05-28 NOTE — Anesthesia Preprocedure Evaluation (Addendum)
Anesthesia Evaluation  Patient identified by MRN, date of birth, ID band Patient awake    Reviewed: Allergy & Precautions, NPO status , Patient's Chart, lab work & pertinent test results  History of Anesthesia Complications Negative for: history of anesthetic complications  Airway Mallampati: II  TM Distance: >3 FB Neck ROM: Full    Dental  (+) Dental Advisory Given   Pulmonary neg pulmonary ROS,    breath sounds clear to auscultation       Cardiovascular hypertension (pregnancy induced), Pt. on medications  Rhythm:Regular Rate:Normal     Neuro/Psych negative neurological ROS     GI/Hepatic Neg liver ROS, GERD  ,  Endo/Other  diabetes (glu 108, diet controlled), Gestational  Renal/GU negative Renal ROS     Musculoskeletal   Abdominal   Peds  Hematology plt 135k   Anesthesia Other Findings   Reproductive/Obstetrics (+) Pregnancy                            Anesthesia Physical Anesthesia Plan  ASA: III  Anesthesia Plan: Epidural   Post-op Pain Management:    Induction:   PONV Risk Score and Plan: 2 and Treatment may vary due to age or medical condition  Airway Management Planned: Natural Airway  Additional Equipment:   Intra-op Plan:   Post-operative Plan:   Informed Consent: I have reviewed the patients History and Physical, chart, labs and discussed the procedure including the risks, benefits and alternatives for the proposed anesthesia with the patient or authorized representative who has indicated his/her understanding and acceptance.   Dental advisory given  Plan Discussed with:   Anesthesia Plan Comments: (Patient identified. Risks/Benefits/Options discussed with patient including but not limited to bleeding, infection, nerve damage, paralysis, failed block, incomplete pain control, headache, blood pressure changes, nausea, vomiting, reactions to medication both or  allergic, itching and postpartum back pain. Confirmed with bedside nurse the patient's most recent platelet count. Confirmed with patient that they are not currently taking any anticoagulation, have any bleeding history or any family history of bleeding disorders. Patient expressed understanding and wished to proceed. All questions were answered. )       Anesthesia Quick Evaluation

## 2017-05-29 MED ORDER — COCONUT OIL OIL: 1.0000 "application " | TOPICAL_OIL | Status: DC | PRN

## 2017-05-29 MED ORDER — SENNOSIDES-DOCUSATE SODIUM 8.6-50 MG PO TABS
2.0000 | ORAL_TABLET | ORAL | Status: DC
  Administered 2017-05-29: 2 via ORAL
  Filled 2017-05-29: qty 2

## 2017-05-29 MED ORDER — ACETAMINOPHEN 325 MG PO TABS: 650.0000 mg | ORAL_TABLET | ORAL | Status: DC | PRN

## 2017-05-29 MED ORDER — ZOLPIDEM TARTRATE 5 MG PO TABS: 5.0000 mg | ORAL_TABLET | Freq: Every evening | ORAL | Status: DC | PRN

## 2017-05-29 MED ORDER — PRENATAL MULTIVITAMIN CH
1.0000 | ORAL_TABLET | Freq: Every day | ORAL | Status: DC
  Administered 2017-05-30: 1 via ORAL
  Filled 2017-05-29: qty 1

## 2017-05-29 MED ORDER — BENZOCAINE-MENTHOL 20-0.5 % EX AERO: 1.0000 "application " | INHALATION_SPRAY | CUTANEOUS | Status: DC | PRN

## 2017-05-29 MED ORDER — DIPHENHYDRAMINE HCL 25 MG PO CAPS: 25.0000 mg | ORAL_CAPSULE | Freq: Four times a day (QID) | ORAL | Status: DC | PRN

## 2017-05-29 MED ORDER — SIMETHICONE 80 MG PO CHEW: 80.0000 mg | CHEWABLE_TABLET | ORAL | Status: DC | PRN

## 2017-05-29 MED ORDER — TETANUS-DIPHTH-ACELL PERTUSSIS 5-2.5-18.5 LF-MCG/0.5 IM SUSP: 0.5000 mL | Freq: Once | INTRAMUSCULAR | Status: DC

## 2017-05-29 MED ORDER — DIBUCAINE 1 % RE OINT: 1.0000 "application " | TOPICAL_OINTMENT | RECTAL | Status: DC | PRN

## 2017-05-29 MED ORDER — MAGNESIUM SULFATE 40 G IN LACTATED RINGERS - SIMPLE
2.0000 g/h | INTRAVENOUS | Status: DC
  Filled 2017-05-29: qty 500

## 2017-05-29 MED ORDER — ONDANSETRON HCL 4 MG PO TABS: 4.0000 mg | ORAL_TABLET | ORAL | Status: DC | PRN

## 2017-05-29 MED ORDER — WITCH HAZEL-GLYCERIN EX PADS: 1.0000 "application " | MEDICATED_PAD | CUTANEOUS | Status: DC | PRN

## 2017-05-29 MED ORDER — ONDANSETRON HCL 4 MG/2ML IJ SOLN: 4.0000 mg | INTRAMUSCULAR | Status: DC | PRN

## 2017-05-29 NOTE — Progress Notes (Signed)
Post Partum Day #1 TSVD with SPEC Subjective: Pt without complaints this morning. Denies HA or blurry vision. Tolerating diet. Pain controlled. Voiding without problems. Breast feeding  Objective: Blood pressure 101/63, pulse 94, temperature 98.5 F (36.9 C), temperature source Oral, resp. rate 16, height 5\' 9"  (1.753 m), weight 259 lb (117.5 kg), SpO2 98 %, unknown if currently breastfeeding.  Physical Exam:  General: no distress Lochia: appropriate Uterine Fundus: firm Incision: healing well DVT Evaluation: No evidence of DVT seen on physical exam, nl DTR's, trace edeam   Recent Labs  05/28/17 0322 05/28/17 1937  HGB 11.3* 11.6*  HCT 32.9* 34.7*    Assessment/Plan: PPD # 1 TSVD SPEC  Stable. Continue with magnesium x 24 hrs. Continue with progressive care   LOS: 2 days   Hermina StaggersMichael L Jeris Roser 05/29/2017, 9:19 AM

## 2017-05-29 NOTE — Progress Notes (Addendum)
Initial visit with pt and spouse to introduce spiritual care services and offer support upon the birth of their son.  Pt was receiving lactation education when I attempted to visit.  Introduced services to Mr Lisa Johnston who was grateful for the support. Will continue to follow.  Please page as further needs arise.  Maryanna ShapeAmanda M. Carley Hammedavee Lomax, M.Div. [facility] Chaplain Pager [phone] Office [phone]

## 2017-05-29 NOTE — Lactation Note (Signed)
This note was copied from a baby's chart. Lactation Consultation Note  Patient Name: Lisa Johnston's Date: 05/29/2017 Reason for consult: Initial assessment  Baby 21 hours old. Mom reports that baby has been sleepy at breast for a lot of feedings, but was able to latch and nurse well at least at one feeding. Assisted mom with positioning and latching baby, but baby not interested in latching. Discussed infant behavior and progression of milk coming to volume. Mom's breast are somewhat v-shaped and wide-spaced; however, mom states that she did not nurse her first child--a 36 year old--but her milk did "come in." Assisted parents to hand express and spoon-feed 5 ml of EBM and baby tolerated well. Assisted mom to begin using DEBP.   Plan is for mom to put baby to breast with cues and at least every 3 hours, then supplement with EBM/formula according to supplementation guidelines--which were given with review. Enc mom to post-pump followed by hand expression. Discussed EBM storage guidelines and answered many questions from both parents. 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 Discussed anatomy vs function and parents aware of OP/BFSG and LC phone line assistance after D/C. Maternal Data Has patient been taught Hand Expression?: Yes Does the patient have breastfeeding experience prior to this delivery?: No  Feeding Feeding Type: Breast Milk Length of feed: 0 min  LATCH Score                   Interventions    Lactation Tools Discussed/Used Tools: Pump Breast pump type: Double-Electric Breast Pump   Consult Status Consult Status: Follow-up Date: 05/30/17    Sherlyn HayJennifer D Krissa Utke 05/29/2017, 4:08 PM

## 2017-05-29 NOTE — Anesthesia Postprocedure Evaluation (Signed)
Anesthesia Post Note  Patient: Lisa Johnston  Procedure(s) Performed: * No procedures listed *     Patient location during evaluation: [facility] Anesthesia Type: Epidural Level of consciousness: awake Pain management: pain level controlled Vital Signs Assessment: post-procedure vital signs reviewed and stable Respiratory status: spontaneous breathing Cardiovascular status: stable Postop Assessment: no headache, no backache, epidural receding, patient able to bend at knees, no signs of nausea or vomiting and adequate PO intake Anesthetic complications: no    Last Vitals:  Vitals:   05/29/17 0600 05/29/17 0700  BP:    Pulse:    Resp: 16 17  Temp:      Last Pain:  Vitals:   05/29/17 0609  TempSrc:   PainSc: 4    Pain Goal: Patients Stated Pain Goal: 2 (05/28/17 2030)               Fanny DanceMULLINS,Taylon Louison

## 2017-05-30 DIAGNOSIS — O1413 Severe pre-eclampsia, third trimester: Secondary | ICD-10-CM

## 2017-05-30 LAB — GLUCOSE, RANDOM: Glucose, Bld: 110 mg/dL — ABNORMAL HIGH (ref 65–99)

## 2017-05-30 MED ORDER — IBUPROFEN 600 MG PO TABS: 600.0000 mg | ORAL_TABLET | Freq: Four times a day (QID) | ORAL | 0 refills | Status: AC

## 2017-05-30 NOTE — Discharge Summary (Signed)
Obstetric Discharge Summary Reason for Admission: induction of labor d/t PEC Prenatal Procedures: ultrasound Intrapartum Procedures: spontaneous vaginal delivery Postpartum Procedures: Magnesium therapy Complications-Operative and Postpartum: none Hemoglobin  Date Value Ref Range Status  05/28/2017 11.6 (L) 12.0 - 15.0 g/dL Final  16/10/960406/20/2018 54.012.0 11.1 - 15.9 g/dL Final  98/11/914702/22/2018 82.913.4  Final   HCT  Date Value Ref Range Status  05/28/2017 34.7 (L) 36.0 - 46.0 % Final  12/14/2016 40  Final   Hematocrit  Date Value Ref Range Status  04/11/2017 35.4 34.0 - 46.6 % Final   Hospital course: Pt was admitted at 37 weeks with PEC. Underwent IOL. Had TSVD without problems. See delivery note for additional information. Pt was started on magnesium and received for 24 hrs post delivery. BP stable 110-130's/70's off magnesium and no BP meds. Progressed to ambulating, voiding, + flatus. Tolerating diet, breast feeding and good oral pain control. Amendable for discharge home on PPD # 2.   Physical Exam:  General: no distress Lochia: appropriate Uterine Fundus: firm Incision: healing well DVT Evaluation: No evidence of DVT seen on physical exam.  Discharge Diagnoses: Term Pregnancy-delivered  Discharge Information: Date: 05/30/2017 Activity: pelvic rest Diet: routine Medications: PNV and Ibuprofen Condition: stable Instructions: refer to practice specific booklet Discharge to: home [facility] Information    [facility] Schedule an appointment as soon as possible for a visit in 4 week(s).   Why:  Postpartum visit Contact information: [address] [place] [id number] [phone]          Newborn Data: Live born female  Birth Weight: 6 lb 12.6 oz (3080 g) APGAR: 8, 8  Home with mother.  Hermina StaggersMichael L Mihaela Fajardo 05/30/2017, 9:28 AM

## 2017-06-04 ENCOUNTER — Encounter: Payer: Self-pay | Admitting: *Deleted

## 2017-06-04 ENCOUNTER — Encounter: Payer: Medicaid Other | Admitting: Obstetrics and Gynecology

## 2017-06-04 NOTE — Telephone Encounter (Signed)
I called both home and mobile numbers and left a message I am calling with some information- please call our office. Will send letter.

## 2017-06-11 ENCOUNTER — Encounter: Payer: Medicaid Other | Admitting: Medical

## 2017-07-10 NOTE — Progress Notes (Deleted)
Pt was admitted at 37 weeks with PEC. Underwent IOL. Had TSVD without problems. Not on BP meds

## 2017-07-11 ENCOUNTER — Ambulatory Visit (INDEPENDENT_AMBULATORY_CARE_PROVIDER_SITE_OTHER): Payer: Medicaid Other | Admitting: Obstetrics and Gynecology

## 2017-07-11 DIAGNOSIS — Z1389 Encounter for screening for other disorder: Secondary | ICD-10-CM

## 2017-07-11 LAB — POCT PREGNANCY, URINE: Preg Test, Ur: NEGATIVE

## 2017-07-11 NOTE — Patient Instructions (Signed)

## 2017-07-11 NOTE — Progress Notes (Signed)
Subjective:     Lisa Johnston is a 36 y.o. female who presents for a postpartum visit. She is 6 weeks postpartum following a spontaneous vaginal delivery. I have fully reviewed the prenatal and intrapartum course. Pt was admitted at 37 weeks with PEC. Underwent IOL. Had TSVD without problems. Not on BP meds. The delivery was at 37 gestational weeks. Outcome: spontaneous vaginal delivery. Anesthesia: epidural. Postpartum course has been unremarkable. Baby's course has been unremarkable. Baby is feeding by bottle - Similac Advance. Bleeding no bleeding. Bowel function is normal. Bladder function is normal. Patient is sexually active. Contraception method is none. Postpartum depression screening: negative.  The following portions of the patient's history were reviewed and updated as appropriate: allergies, current medications, past family history, past medical history, past social history, past surgical history and problem list.  Review of Systems Pertinent items are noted in HPI.   Objective:    BP 124/70   Pulse 85   Wt 110.3 kg (243 lb 3.2 oz)   BMI 35.91 kg/m   General:  alert, cooperative and no distress  Lungs: normal work of breathing and rate  Heart:  normal rate and rhythm  Abdomen: soft, non-tender; bowel sounds normal; no masses,  no organomegaly   Vulva:  normal  Vagina: normal vagina with some erythema along perineum (had 2nd degree laceration)  Cervix:  retroverted  Corpus: normal  Adnexa:  normal adnexa  Psych Normal mood and affect        Assessment:   Normal postpartum exam. Pap smear not done at today's visit.   Plan:    1. Contraception: desires IUD however patient ahd unprotected intercourse alst night and due to policy has to follow-up 2. Follow up in: 2 weeks or as needed.     Caryl Ada, DO OB Fellow

## 2017-07-27 ENCOUNTER — Encounter: Payer: Self-pay | Admitting: Obstetrics and Gynecology

## 2017-07-27 ENCOUNTER — Ambulatory Visit (INDEPENDENT_AMBULATORY_CARE_PROVIDER_SITE_OTHER): Payer: Medicaid Other | Admitting: Obstetrics and Gynecology

## 2017-07-27 VITALS — BP 132/79 | HR 75 | Wt 245.5 lb

## 2017-07-27 DIAGNOSIS — Z30430 Encounter for insertion of intrauterine contraceptive device: Secondary | ICD-10-CM

## 2017-07-27 DIAGNOSIS — Z3202 Encounter for pregnancy test, result negative: Secondary | ICD-10-CM | POA: Diagnosis not present

## 2017-07-27 LAB — POCT PREGNANCY, URINE: Preg Test, Ur: NEGATIVE

## 2017-07-27 MED ORDER — LEVONORGESTREL 18.6 MCG/DAY IU IUD
INTRAUTERINE_SYSTEM | Freq: Once | INTRAUTERINE | Status: AC
  Administered 2017-07-27: 1 via INTRAUTERINE

## 2017-07-27 NOTE — Patient Instructions (Signed)

## 2017-07-27 NOTE — Progress Notes (Signed)
    GYNECOLOGY OFFICE PROCEDURE NOTE  Lisa Johnston is a 36 y.o. [id number] here for Liletta IUD insertion. No GYN concerns.  Last pap smear was on 11/10/16 and was normal. S/p SVD 05/28/17 and doing well since.  IUD Insertion Procedure Note Patient identified, informed consent performed, consent signed.   Discussed risks of irregular bleeding, cramping, infection, malpositioning or misplacement of the IUD outside the uterus which may require further procedure such as laparoscopy. Time out was performed.  Urine pregnancy test negative.  Speculum placed in the vagina.  Cervix visualized.  Cleaned with Betadine x 2.  Grasped anteriorly with a single tooth tenaculum.  Uterus sounded to 9 cm.  Liletta IUD placed per manufacturer's recommendations.  Strings trimmed to 3 cm. Tenaculum was removed, good hemostasis noted.  Patient tolerated procedure well.   Patient was given post-procedure instructions. Patient advised to use backup contraception for one week after insertion. Patient was also asked to check IUD strings periodically and follow up in 4 weeks for IUD check.   Baldemar Lenis, M.D. Attending Obstetrician & Gynecologist, [facility] for [employer], [facility]

## 2017-07-27 NOTE — Addendum Note (Signed)
Addended by: Garret Reddish on: 07/27/2017 12:40 PM   Modules accepted: Orders

## 2017-07-31 ENCOUNTER — Encounter: Payer: Self-pay | Admitting: Obstetrics and Gynecology

## 2017-08-15 ENCOUNTER — Ambulatory Visit: Payer: Medicaid Other | Admitting: Obstetrics and Gynecology

## 2018-04-14 IMAGING — US US MFM OB DETAIL+14 WK
1 series · 13 of 28 positions shown · non-contrast
Comparison: none

[Series 1: us mfm ob detail+14 wk · 59 acquisitions, 13 frames shown]
[im 3/59]
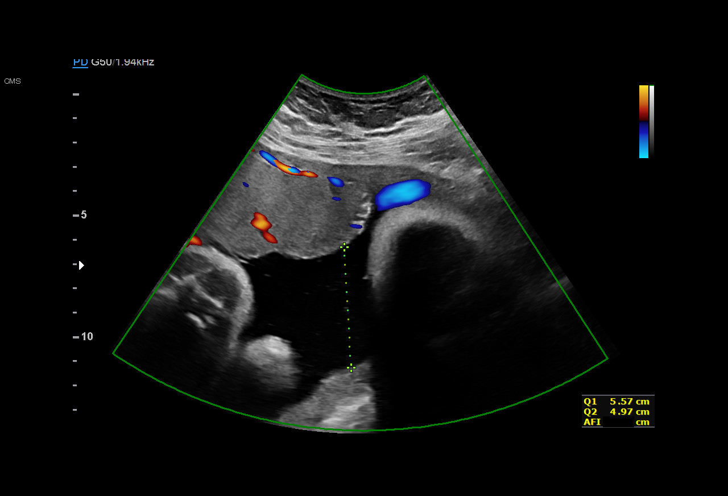
[im 7/59]
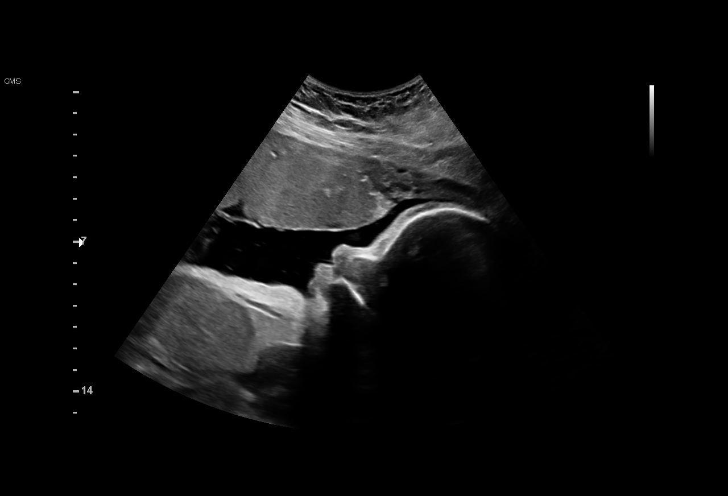
[im 11/59]
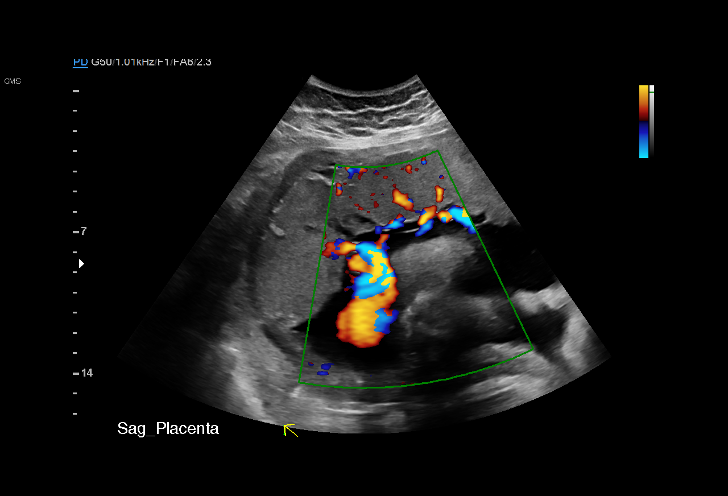
[im 16/59]
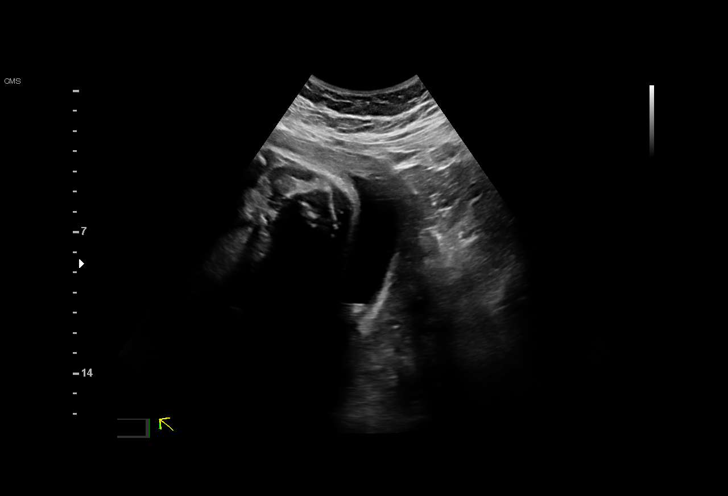
[im 20/59]
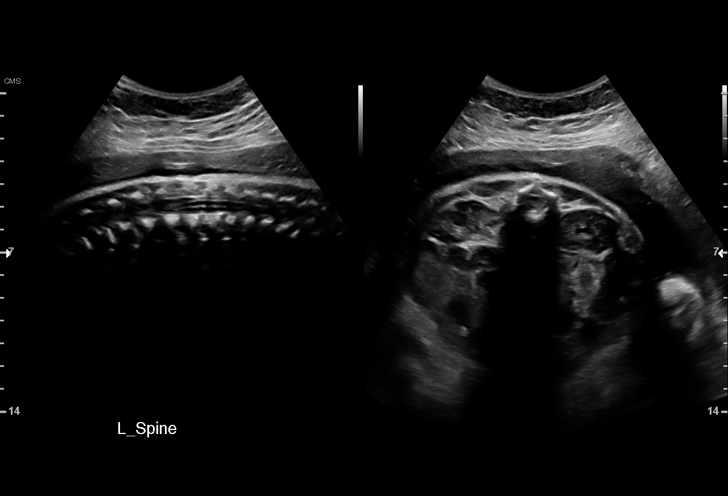
[im 24/59]
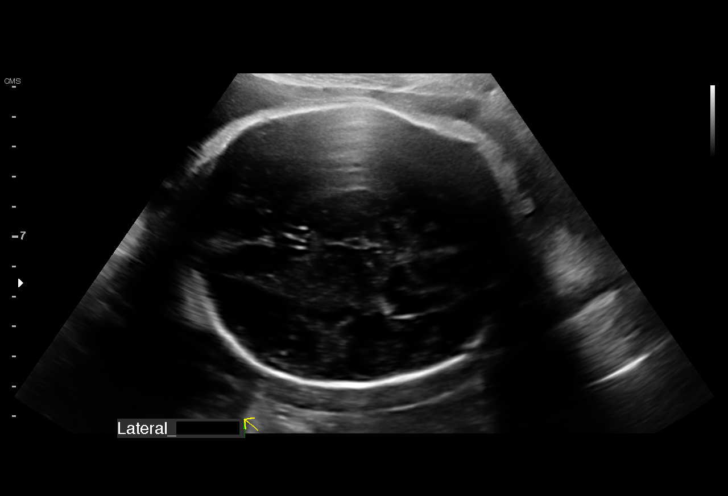
[im 31/59]
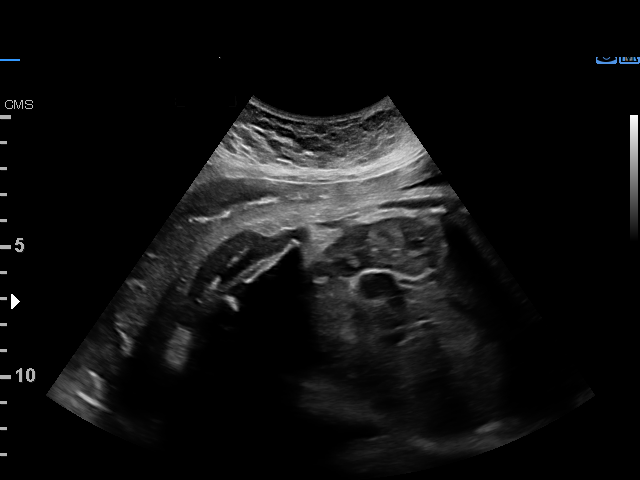
[im 35/59]
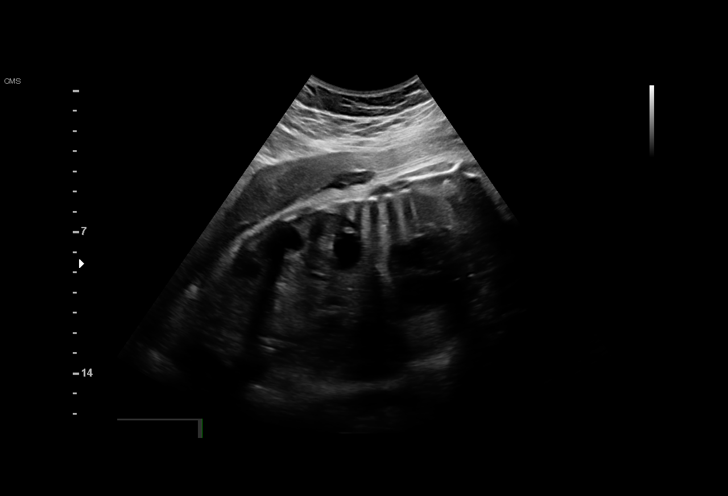
[im 39/59]
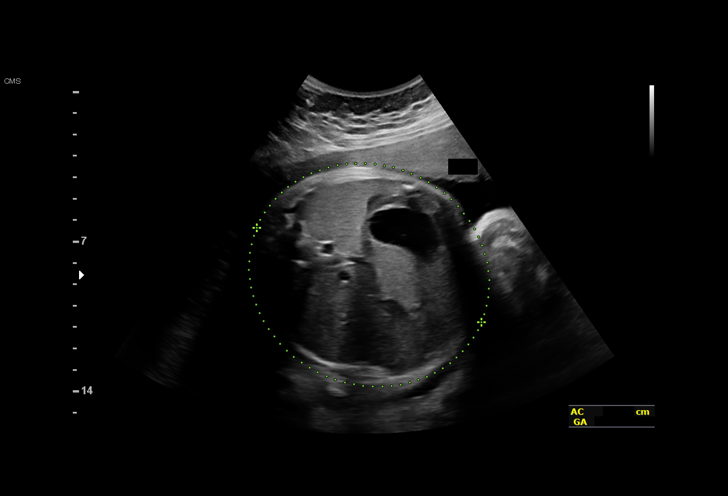
[im 43/59]
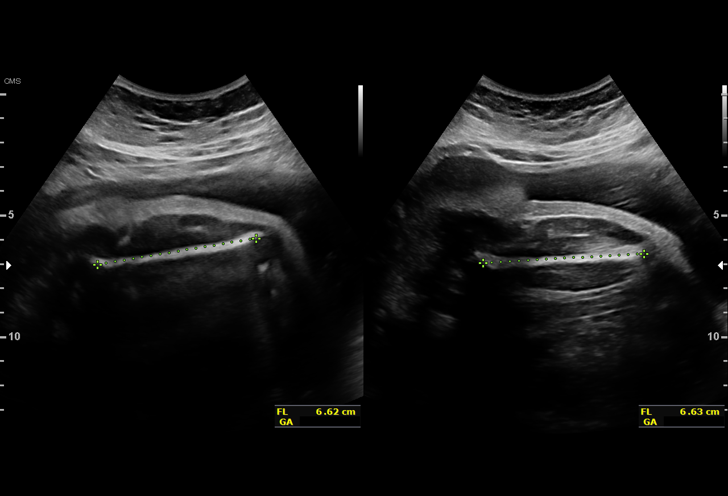
[im 48/59]
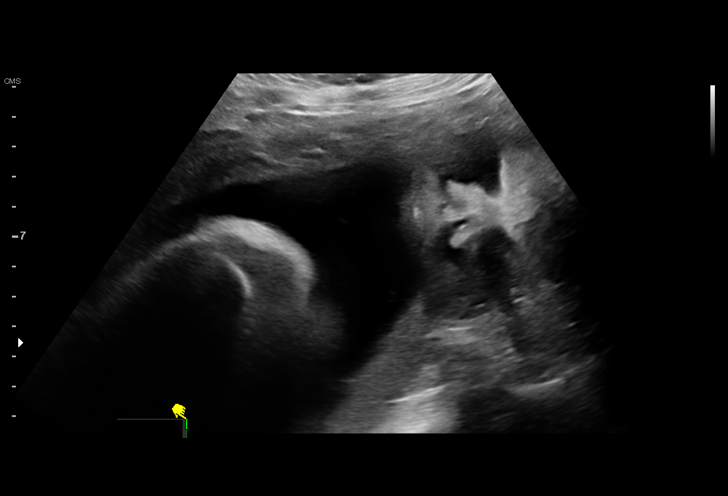
[im 52/59]
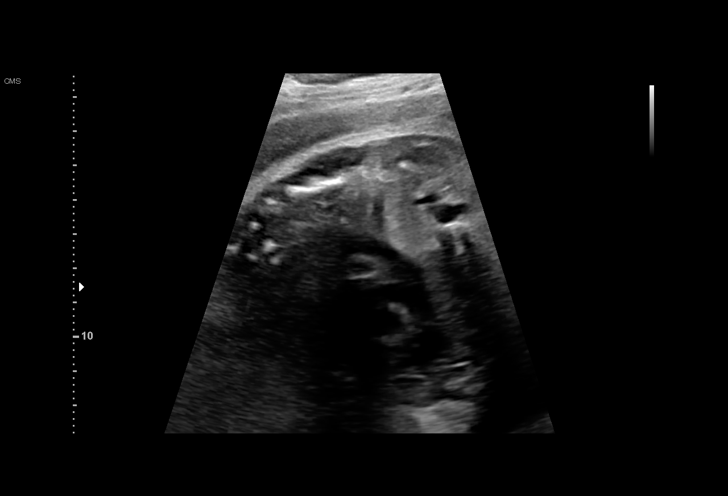
[im 56/59]
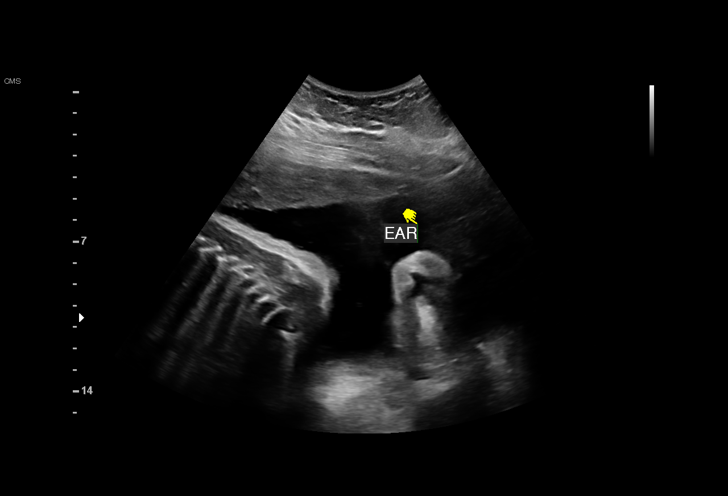

[13 of 28 positions shown; findings below may reference images not displayed]

OB/Gyn Clinic

1  JIEXA DRON            [id number]      [id number]     [id number]
Indications

36 weeks gestation of pregnancy
Advanced maternal age multigravida 35+,
third trimester
Obesity complicating pregnancy, third
trimester (pregravid BMI 32)
Gestational diabetes in pregnancy, diet
controlled
Poor obstetric history: Previous preeclampsia
Poor obstetric history: Previous preterm
delivery, (36 wks - pre e)
Encounter for fetal anatomic survey
Uterine fibroids affecting pregnancy in third  O34.13,
trimester, antepartum
OB History

Gravidity:    3         Term:   1        Prem:   0        SAB:   0
TOP:          1       Ectopic:  0        Living: 1
Fetal Evaluation

Num Of Fetuses:     1
Fetal Heart         167
Rate(bpm):
Cardiac Activity:   Observed
Presentation:       Cephalic
Placenta:           Right lateral, above cervical os
P. Cord Insertion:  Visualized
Amniotic Fluid
AFI FV:      Subjectively within normal limits

AFI Sum(cm)     %Tile       Largest Pocket(cm)
18.33           68

RUQ(cm)       RLQ(cm)       LUQ(cm)        LLQ(cm)
5.57
Biometry

BPD:      93.2  mm     G. Age:  37w 6d         95  %    CI:        73.28   %   70 - 86
FL/HC:      19.1   %   20.1 -
HC:       346   mm     G. Age:  40w 1d         96  %    HC/AC:      1.02       0.93 -
AC:      339.7  mm     G. Age:  37w 6d         95  %    FL/BPD:     71.0   %   71 - 87
FL:       66.2  mm     G. Age:  34w 1d          8  %    FL/AC:      19.5   %   20 - 24
HUM:      61.5  mm     G. Age:  35w 4d         56  %

Est. FW:    0243  gm    6 lb 15 oz      84  %
Gestational Age

U/S Today:     37w 4d                                        EDD:   06/04/17
Best:          36w 0d    Det. By:   Early Ultrasound         EDD:   06/15/17
(12/01/16)
Anatomy

Cranium:               Appears normal         Aortic Arch:            Appears normal
Cavum:                 Appears normal         Ductal Arch:            Not well visualized
Ventricles:            Appears normal         Diaphragm:              Appears normal
Choroid Plexus:        Appears normal         Stomach:                Appears normal, left
sided
Cerebellum:            Appears normal         Abdomen:                Appears normal
Posterior Fossa:       Not well visualized    Abdominal Wall:         Not well visualized
Nuchal Fold:           Not applicable (>20    Cord Vessels:           Not well visualized
wks GA)
Face:                  Profile nl; orbits not Kidneys:                Appear normal
well visualized
Lips:                  Appears normal         Bladder:                Appears normal
Thoracic:              Appears normal         Spine:                  Appears normal
Heart:                 Appears normal         Upper Extremities:      Visualized
(4CH, axis, and
situs)
RVOT:                  Appears normal         Lower Extremities:      Visualized
LVOT:                  Not well visualized

Other:  Gender not well visualized. Technically difficult due to advanced
gestational age.
Cervix Uterus Adnexa

Cervix
Not visualized (advanced GA >40wks)

Uterus
Single fibroid noted, see table below.

Left Ovary
Within normal limits.
Right Ovary
Not visualized.

Cul De Sac:   No free fluid seen.

Adnexa:       No abnormality visualized.
Myomas

Site                     L(cm)      W(cm)      D(cm)      Location
Anterior

Blood Flow                 RI        PI       Comments

Impression

Single IUP at 36w 0d
Late transfer of care, gestational diabetes (diet)
Limited views of the anatomy obtained due to late gestational
age and fetal position
No gross anomalies noted
Anterior uterine myoma noted as described above
The estimated fetal weight is at the 84th %tile.  The AC
measures at the 95th %tile.
Right lateral placenta without previa
Normal amniotic fluid volume
Recommendations

Follow-up ultrasounds as clinically indicated.

## 2019-04-03 ENCOUNTER — Other Ambulatory Visit: Payer: Medicaid Other

## 2019-04-03 ENCOUNTER — Telehealth: Payer: Self-pay | Admitting: Hematology

## 2019-04-03 DIAGNOSIS — Z20822 Contact with and (suspected) exposure to covid-19: Secondary | ICD-10-CM

## 2019-04-03 NOTE — Telephone Encounter (Signed)
Received request from Silverio Decamp from occupational health to test patient. / Orders placed and testing scheduled for today at 11:45

## 2019-04-05 LAB — NOVEL CORONAVIRUS, NAA: SARS-CoV-2, NAA: NOT DETECTED

## 2019-08-12 ENCOUNTER — Other Ambulatory Visit: Payer: Self-pay

## 2019-08-12 DIAGNOSIS — Z20822 Contact with and (suspected) exposure to covid-19: Secondary | ICD-10-CM

## 2019-08-14 LAB — NOVEL CORONAVIRUS, NAA: SARS-CoV-2, NAA: NOT DETECTED

## 2019-11-17 ENCOUNTER — Ambulatory Visit: Payer: 59 | Attending: Internal Medicine

## 2019-11-17 DIAGNOSIS — Z20822 Contact with and (suspected) exposure to covid-19: Secondary | ICD-10-CM

## 2019-11-18 LAB — NOVEL CORONAVIRUS, NAA: SARS-CoV-2, NAA: DETECTED — AB

## 2019-11-19 ENCOUNTER — Telehealth: Payer: Self-pay | Admitting: Unknown Physician Specialty

## 2019-11-19 NOTE — Telephone Encounter (Signed)
Called to discuss with patient about Covid symptoms and the use of bamlanivimab, a monoclonal antibody infusion for those with mild to moderate Covid symptoms and at a high risk of hospitalization.  Pt is qualified for this infusion at the [facility] due to BMI>35   Message left to call back  

## 2019-12-24 ENCOUNTER — Ambulatory Visit (INDEPENDENT_AMBULATORY_CARE_PROVIDER_SITE_OTHER): Payer: 59 | Admitting: Family Medicine

## 2019-12-24 ENCOUNTER — Encounter: Payer: Self-pay | Admitting: Family Medicine

## 2019-12-24 ENCOUNTER — Other Ambulatory Visit: Payer: Self-pay

## 2019-12-24 VITALS — BP 132/91 | HR 74 | Temp 97.5°F | Resp 12 | Ht 69.0 in | Wt 241.0 lb

## 2019-12-24 DIAGNOSIS — Z8632 Personal history of gestational diabetes: Secondary | ICD-10-CM | POA: Insufficient documentation

## 2019-12-24 DIAGNOSIS — J3489 Other specified disorders of nose and nasal sinuses: Secondary | ICD-10-CM | POA: Insufficient documentation

## 2019-12-24 DIAGNOSIS — O139 Gestational [pregnancy-induced] hypertension without significant proteinuria, unspecified trimester: Secondary | ICD-10-CM | POA: Insufficient documentation

## 2019-12-24 DIAGNOSIS — Z83438 Family history of other disorder of lipoprotein metabolism and other lipidemia: Secondary | ICD-10-CM

## 2019-12-24 DIAGNOSIS — K1379 Other lesions of oral mucosa: Secondary | ICD-10-CM

## 2019-12-24 DIAGNOSIS — Z809 Family history of malignant neoplasm, unspecified: Secondary | ICD-10-CM

## 2019-12-24 DIAGNOSIS — Z7689 Persons encountering health services in other specified circumstances: Secondary | ICD-10-CM | POA: Diagnosis not present

## 2019-12-24 DIAGNOSIS — F909 Attention-deficit hyperactivity disorder, unspecified type: Secondary | ICD-10-CM | POA: Insufficient documentation

## 2019-12-24 DIAGNOSIS — Z975 Presence of (intrauterine) contraceptive device: Secondary | ICD-10-CM

## 2019-12-24 DIAGNOSIS — R221 Localized swelling, mass and lump, neck: Secondary | ICD-10-CM

## 2019-12-24 DIAGNOSIS — E0789 Other specified disorders of thyroid: Secondary | ICD-10-CM

## 2019-12-24 DIAGNOSIS — Z8759 Personal history of other complications of pregnancy, childbirth and the puerperium: Secondary | ICD-10-CM | POA: Insufficient documentation

## 2019-12-24 DIAGNOSIS — Z8349 Family history of other endocrine, nutritional and metabolic diseases: Secondary | ICD-10-CM

## 2019-12-24 DIAGNOSIS — R030 Elevated blood-pressure reading, without diagnosis of hypertension: Secondary | ICD-10-CM

## 2019-12-24 DIAGNOSIS — Z8249 Family history of ischemic heart disease and other diseases of the circulatory system: Secondary | ICD-10-CM

## 2019-12-24 DIAGNOSIS — Z833 Family history of diabetes mellitus: Secondary | ICD-10-CM | POA: Insufficient documentation

## 2019-12-24 DIAGNOSIS — Z8659 Personal history of other mental and behavioral disorders: Secondary | ICD-10-CM

## 2019-12-24 NOTE — Progress Notes (Signed)
New patient office visit note:  Impression and Recommendations:    1. Encounter to establish care with new doctor   2. Morbid obesity (HCC)   3. History of gestational diabetes   4. History of pregnancy induced hypertension   5. Elevated blood pressure reading   6. Thyroid fullness   7. Fullness of neck   8. History of OCD (obsessive compulsive disorder)   9. Attention deficit hyperactivity disorder (ADHD), unspecified ADHD type   10. Reoccurring mouth sores-   will obtain future biopsy by her oral surgeon   11. Reoccurring nasal sores   12. FHx: early coronary artery disease   13. Family history of type 1 diabetes mellitus   14. Family history of thyroid disease   15. Family history of cancer- prostate and throat   16. Family history of combined hyperlipidemia   17. Family history of hypertension in mother   55. IUD (intrauterine device) in place     Encounter to Establish Care with New Doctor - Extensive discussion held with patient regarding establishing as a new patient.  Discussed policies and practices here at the clinic, and answered all questions about care team and health management during appointment.  - Discussed need for patient to continue to obtain management and screenings with all established specialists.  Educated patient at length about the critical importance of keeping health maintenance up to date.  - Participated in lengthy conversation and all questions were answered.  Female Health - Per patient, has one sexual partner and no known history of female cancers.  Discussed that given her lower risk status, patient may follow up with GYN every 3-4 years minimum for repeat pap smears.  - Encouraged patient to follow up with GYN for all female health concerns, especially as she has an IUD in place.  - Will continue to monitor alongside specialist.  Sores in Mouth, Nose, Vagina - Advised patient to obtain additional tissue biopsies for further  assessment. - Encouraged patient to follow up with specialists as advised to seek more specific diagnosis.  -  Will order extended thyroid panel along with future lab work.  - Ultrasound ordered today to assess fullness anterior and L thyroid region  - Will continue to monitor.  Health Counseling & Preventative Maintenance - Advised patient to continue working toward exercising and prudent weight loss to improve overall mental, physical, and emotional health.    - Reviewed the "spokes of the wheel" of mood and health management.  Stressed the importance of ongoing prudent habits, including regular exercise, appropriate sleep hygiene, healthful dietary habits, and prayer/meditation to relax.  - Encouraged patient to engage in daily physical activity, especially a formal exercise routine.  Recommended that the patient eventually strive for at least 150 minutes of moderate cardiovascular activity per week according to guidelines established by the [facility].   - Healthy dietary habits encouraged, including low-carb, and high amounts of lean protein in diet.   - Patient should also consume adequate amounts of water.  - Health counseling performed.  All questions answered.   Education and routine counseling performed. Handouts provided.  Recommendations - Return near future for full fasting lab work and televisit 3-5 days later to review.   Orders Placed This Encounter  Procedures  . US SOFT TISSUE HEAD & NECK (NON-THYROID)  . TSH + free T4  . T3  . Thyroid antibodies  . CBC with Differential/Platelet  . Comprehensive metabolic panel  . Hemoglobin  A1c  . Lipid panel  . VITAMIN D 25 Hydroxy (Vit-D Deficiency, Fractures)    Medications Discontinued During This Encounter  Medication Reason  . Prenatal Vit-Fe Fumarate-FA (MULTIVITAMIN-PRENATAL) 27-0.8 MG TABS tablet Error     Gross side effects, risk and benefits, and alternatives of medications discussed with patient.  Patient is  aware that all medications have potential side effects and we are unable to predict every side effect or drug-drug interaction that may occur.  Expresses verbal understanding and consents to current therapy plan and treatment regimen.   Please see AVS handed out to patient at the end of our visit for further patient instructions/ counseling done pertaining to today's office visit.    Note:  This document was prepared using Dragon voice recognition software and may include unintentional dictation errors.   The 21st Century Cures Act was signed into law in 2016 which includes the topic of electronic health records.  This provides immediate access to information in MyChart.  This includes consultation notes, operative notes, office notes, lab results and pathology reports.  If you have any questions about what you read please let us know at your next visit or call us at the office.  We are right here with you.   This case required medical decision making of at least moderate complexity.  This document serves as a record of services personally performed by Lisa Lot, DO. It was created on her behalf by Lisa Johnston, a trained medical scribe. The creation of this record is based on the scribe's personal observations and the provider's statements to them.   This case required medical decision making of at least moderate complexity. The above documentation from Lisa Johnston, medical scribe, has been reviewed by Lisa Johnston, D.O.      ---------------------------------------------------------------------------------------------------------------------------------------------------------------------------------------------    Subjective:     Lisa Johnston, am serving as scribe for Dr.Myalee Johnston.   HPI: Lisa Johnston is a pleasant 39 y.o. female who presents to [facility] today to review their medical history with me and establish care.     I asked the patient to review their chronic problem list with me to ensure everything was updated and accurate.    All recent office visits with other providers, any medical records that patient brought in etc  - I reviewed today.     We asked pt to get [place] their medical records from [facility] providers/ specialists that they had seen within the past 3-5 years- if they are in private practice and/or do not work for [employer], [facility], [place], [facility] or [employer] owned practice.  Told them to call their specialists to clarify this if they are not sure.    Reason for Establishing Care:  States she has not been to the doctor in a really long time.  Notes "I'm ready to start taking care of myself."  Social History  Married to spouse Antonio.  Has two children, one daughter age 31, one age 49 and a half.  Works as a Product manager in oral surgery context.  Tobacco Use Never smoker.  Alcohol Use No excessive use. Drinks wine a couple of times per month. "It depends on my 39 year old, let's be real."  Exercise Habits Was an athlete when she was younger. Notes she does enjoy being active, but it's difficult finding the energy now. Says "I just really would rather not; I don't have the drive to do it anymore."  Family History  Dad had prostate cancer. Dad had a heart attack in his 29's.  Grandmother had throat cancer  Family history of high cholesterol.  Daughter has type 1 diabetes Daughter has hyperlipidemia. Daughter has Hashimoto's.  Past Medical History  - Female Health Followed up with GYN Dr. Earlene Plater in the past.   Notes she has an IUD in place, but hasn't seen GYN since her son was born 2 yrs ago.  Has one sexual partner and no known family history of female cancers/or abnormal Pap smears  Has never been told she has PCOS.  Says "my [employer] degree says I have all the symptoms of that."   - Recent COVID-19 Infection Had COVID-19 in January of 2021.   - History of  ADHD, Anxiety, OCD Has ADHD, anxiety, and OCD.    Notes "about ten years ago, I felt like I had a good handle on it, and came off of Adderall.  I was on Adderall for a really long time."  "Hasn't been on medication for any of this in ten years."  To treat herself, she has not engaged in exercise, meditation, or yoga; "none of that."  Notes in the past, she was in a marriage where things were "different for her then;" got out of the marriage, felt "new me, new life."  Was running for exercise at the time and felt that this helped with her symptoms then.  Says since then, she gained 45 lbs, became pregnant with her son, and "definitely can feel these things knocking back on my door really heavily."   - Pre-Eclampsia Notes she had high blood pressure while pregnant, but no known concerns since.  Hasn't been checking her blood pressure regularly.  Sometimes checks it at work, and notes "it usually runs low, 115/68" at work.  Notes "never any cause for concern," but she hasn't checked it for months.   - Weight Gain and desire for Weight Loss Gained 45 lbs with her son (71.7 years old), her most recent pregnancy.  Notes she used to exercise often.  Regarding weight, "I take it off and then I put it back on."  She did Weight Watchers this past year, lost 45 lbs, "got sick of measuring everything in her life," and then gained the 45 lbs back again.  Says "it's just so hard."  Notes she's not as active as she needs to be, but "I should be a size two" based on how much she feels moves during the day at work and everything else.  - Thyroid Concerns Patient was told in the past that she has a thyroid issue by her daughter's endocrinologist.  Notes he was able to "look at her and tell her this."  She had blood work done while pregnant with her son, and was told that she needs to monitor this.  However, "I haven't had my blood work checked in ages."  - Sores in Mouth, Nose, Vagina Her main concern today  is the fact that she keeps getting "weird sores in her mouth, nose, and vagina."  Her oral surgeon did a biopsy on one of the oral episodes and sent it to [facility]; they didn't know what it was.  Recommended she should follow up and see if she has an autoimmune disease.  Says "I just need to figure out what's going on."    Says these episodes used to happen once every 3 months.  Now they occur once every 2 weeks.    The sore in her nose  is always in the same spot ("the inside of my nostril, towards the middle"), same with her vagina.  Notes the sore is "not open; it's like a little pea."  Says "it comes and it hurts a little bit."  Feels like it's a little nodule.  "Not open, not oozing," but notes when she wipes, it hurts.  In her mouth, it used to occur in the same spot in the lower left side, but now it's all over her mouth.  Her oral surgeon suggested sending off another specialized biopsy, but they have not done this yet.  Notes these "sores" all come up at the same time in her nose, mouth, and vagina.  She can tell when they're starting; notes they feel "like a pizza burn."  Her mom notes that the patient had similar complaints in her youth when she was on her period.  She is unsure if they are associated with her period at present.  Says they typically come up and go away very quickly.  Notes sometimes they occur on a Friday and then by Monday when she goes in for assessment, they're gone.    Wt Readings from Last 3 Encounters:  12/24/19 241 lb (109.3 kg)  07/27/17 245 lb 8 oz (111.4 kg)  07/11/17 243 lb 3.2 oz (110.3 kg)   BP Readings from Last 3 Encounters:  12/24/19 (!) 132/91  07/27/17 132/79  07/11/17 124/70   Pulse Readings from Last 3 Encounters:  12/24/19 74  07/27/17 75  07/11/17 85   BMI Readings from Last 3 Encounters:  12/24/19 35.59 kg/m  07/27/17 36.25 kg/m  07/11/17 35.91 kg/m    Patient Care Team    Relationship Specialty Notifications Start End  Lisa Lotpalski,  Havoc Sanluis, DO PCP - General Family Medicine  12/24/19     Patient Active Problem List   Diagnosis Date Noted  . Morbid obesity (HCC) 12/24/2019  . History of gestational diabetes 04/12/2017  . Pregnancy induced hypertension   . Pre-eclampsia, severe 05/27/2017  . Reoccurring nasal sores 12/24/2019  . Reoccurring mouth sores-   will obtain future biopsy by her oral surgeon 12/24/2019  . Fullness of neck 12/24/2019  . Thyroid fullness 12/24/2019  . FHx: early coronary artery disease 12/24/2019  . Family history of type 1 diabetes mellitus 12/24/2019  . Family history of thyroid disease 12/24/2019  . Family history of hypertension in mother 12/24/2019  . Family history of combined hyperlipidemia 12/24/2019  . Family history of cancer- prostate and throat 12/24/2019  . Attention deficit hyperactivity disorder (ADHD) 12/24/2019  . History of OCD (obsessive compulsive disorder) 12/24/2019  . History of pregnancy induced hypertension 12/24/2019  . History of gestational diabetes 12/24/2019  . IUD (intrauterine device) in place 12/24/2019  . AMA (advanced maternal age) multigravida 35+ 04/16/2017       As reported by pt:  Past Medical History:  Diagnosis Date  . Pregnancy induced hypertension   . Preterm labor      Past Surgical History:  Procedure Laterality Date  . NO PAST SURGERIES       Family History  Problem Relation Age of Onset  . Cancer Father   . Heart disease Father   . Stroke Father   . Diabetes Daughter   . Hyperlipidemia Daughter   . Cancer Maternal Grandmother   . Diabetes Mother   . Hyperlipidemia Mother   . Hypertension Mother      Social History   Substance and Sexual Activity  Drug Use No  Social History   Substance and Sexual Activity  Alcohol Use Yes   Comment: occ glass of wine     Social History   Tobacco Use  Smoking Status Never Smoker  Smokeless Tobacco Never Used     Current Meds  Medication Sig  . acetaminophen  (TYLENOL) 500 MG tablet Take 500 mg by mouth every 6 (six) hours as needed (taking 2 daily).  . cetirizine (ZYRTEC) 10 MG chewable tablet Chew 10 mg by mouth daily.  Marland Kitchen ibuprofen (ADVIL,MOTRIN) 600 MG tablet Take 1 tablet (600 mg total) by mouth every 6 (six) hours. (Patient taking differently: Take 600 mg by mouth every 6 (six) hours. 600-800mg  daily)    Allergies: Patient has no known allergies.   Review of Systems  Constitutional: Negative for chills, diaphoresis, fever, malaise/fatigue and weight loss.  HENT: Negative for congestion, sore throat and tinnitus.   Eyes: Negative for blurred vision, double vision and photophobia.  Respiratory: Negative for cough and wheezing.   Cardiovascular: Negative for chest pain and palpitations.  Gastrointestinal: Negative for blood in stool, diarrhea, nausea and vomiting.  Genitourinary: Negative for dysuria, frequency and urgency.  Musculoskeletal: Negative for joint pain and myalgias.  Skin: Negative for itching and rash.  Neurological: Positive for headaches (chronic). Negative for dizziness, focal weakness and weakness.  Endo/Heme/Allergies: Negative for environmental allergies and polydipsia. Does not bruise/bleed easily.  Psychiatric/Behavioral: Negative for depression and memory loss. The patient is nervous/anxious (chronic). The patient does not have insomnia.         Objective:   Blood pressure (!) 132/91, pulse 74, temperature (!) 97.5 F (36.4 C), temperature source Oral, resp. rate 12, height 5\' 9"  (1.753 m), weight 241 lb (109.3 kg), last menstrual period 12/23/2019, SpO2 100 %. Body mass index is 35.59 kg/m. General: Well Developed, well nourished, and in no acute distress.  Neuro: Alert and oriented x3, extra-ocular muscles intact, sensation grossly intact.  HEENT:   Nasal cavity is clear.  Oral cavity showed no ulcerations or sores as well.  No lesions or abnormalities appreciated.   [place]/AT, PERRLA, neck supple, No carotid  bruits   Neck:   fullness of anterior soft tissues of her neck, with questionable fullness of left lobe of thyroid.  No lymphadenopathy or tenderness appreciated. Skin: no gross rashes  Cardiac: Regular rate and rhythm Respiratory: Essentially clear to auscultation bilaterally. Not using accessory muscles, speaking in full sentences.  Abdominal: not grossly distended Musculoskeletal: Ambulates w/o diff, FROM * 4 ext.  Vasc: less 2 sec cap RF, warm and pink  Psych:  No HI/SI, judgement and insight good, Euthymic mood. Full Affect.    Recent Results (from the past 2160 hour(s))  Novel Coronavirus, NAA ([facility])     Status: Abnormal   Collection Time: 11/17/19 11:43 AM   Specimen: Nasopharyngeal(NP) swabs in vial transport medium   NASOPHARYNGE  TESTING  Result Value Ref Range   SARS-CoV-2, NAA Detected (A) Not Detected    Comment: This nucleic acid amplification test was developed and its performance characteristics determined by [employer]. Nucleic acid amplification tests include RT-PCR and TMA. This test has not been FDA cleared or approved. This test has been authorized by FDA under an Emergency Use Authorization (EUA). This test is only authorized for the duration of time the declaration that circumstances exist justifying the authorization of the emergency use of in vitro diagnostic tests for detection of SARS-CoV-2 virus and/or diagnosis of COVID-19 infection under section 564(b)(1) of the Act, 21 U.S.C.  360bbb-3(b) (1), unless the authorization is terminated or revoked sooner. When diagnostic testing is negative, the possibility of a false negative result should be considered in the context of a patient's recent exposures and the presence of clinical signs and symptoms consistent with COVID-19. An individual without symptoms of COVID-19 and who is not shedding SARS-CoV-2 virus wo uld expect to have a negative (not detected) result in this assay.

## 2019-12-24 NOTE — Patient Instructions (Addendum)
Look into the book Atomic Habits.   Please realize, EXERCISE IS MEDICINE!  -  American Heart Association [facility]) guidelines for exercise : If you are in good health, without any medical conditions, you should engage in 150-300 minutes of moderate intensity aerobic activity per week.  This means you should be huffing and puffing throughout your workout.   Engaging in regular exercise will improve brain function and memory, as well as improve mood, boost immune system and help with weight management.  As well as the other, more well-known effects of exercise such as decreasing blood sugar levels, decreasing blood pressure,  and decreasing bad cholesterol levels/ increasing good cholesterol levels.     -  The AHA strongly endorses consumption of a diet that contains a variety of foods from all the food categories with an emphasis on fruits and vegetables; fat-free and low-fat dairy products; cereal and grain products; legumes and nuts; and fish, poultry, and/or extra lean meats.    Excessive food intake, especially of foods high in saturated and trans fats, sugar, and salt, should be avoided.    Adequate water intake of roughly 1/2 of your weight in pounds, should equal the ounces of water per day you should drink.  So for instance, if you're 200 pounds, that would be 100 ounces of water per day.         Mediterranean Diet  Why follow it? Research shows. . Those who follow the Mediterranean diet have a reduced risk of heart disease  . The diet is associated with a reduced incidence of Parkinson's and Alzheimer's diseases . People following the diet may have longer life expectancies and lower rates of chronic diseases  . The Dietary Guidelines for Americans recommends the Mediterranean diet as an eating plan to promote health and prevent disease  What Is the Mediterranean Diet?  . Healthy eating plan based on typical foods and recipes of Mediterranean-style cooking . The diet is primarily a plant  based diet; these foods should make up a majority of meals   Starches - Plant based foods should make up a majority of meals - They are an important sources of vitamins, minerals, energy, antioxidants, and fiber - Choose whole grains, foods high in fiber and minimally processed items  - Typical grain sources include wheat, oats, barley, corn, brown rice, bulgar, farro, millet, polenta, couscous  - Various types of beans include chickpeas, lentils, fava beans, black beans, white beans   Fruits  Veggies - Large quantities of antioxidant rich fruits & veggies; 6 or more servings  - Vegetables can be eaten raw or lightly drizzled with oil and cooked  - Vegetables common to the traditional Mediterranean Diet include: artichokes, arugula, beets, broccoli, brussel sprouts, cabbage, carrots, celery, collard greens, cucumbers, eggplant, kale, leeks, lemons, lettuce, mushrooms, okra, onions, peas, peppers, potatoes, pumpkin, radishes, rutabaga, shallots, spinach, sweet potatoes, turnips, zucchini - Fruits common to the Mediterranean Diet include: apples, apricots, avocados, cherries, clementines, dates, figs, grapefruits, grapes, melons, nectarines, oranges, peaches, pears, pomegranates, strawberries, tangerines  Fats - Replace butter and margarine with healthy oils, such as olive oil, canola oil, and tahini  - Limit nuts to no more than a handful a day  - Nuts include walnuts, almonds, pecans, pistachios, pine nuts  - Limit or avoid candied, honey roasted or heavily salted nuts - Olives are central to the Mediterranean diet - can be eaten whole or used in a variety of dishes   Meats Protein - Limiting red meat:  no more than a few times a month - When eating red meat: choose lean cuts and keep the portion to the size of deck of cards - Eggs: approx. 0 to 4 times a week  - Fish and lean poultry: at least 2 a week  - Healthy protein sources include, chicken, [place], lean beef, lamb - Increase intake of  seafood such as tuna, salmon, trout, mackerel, shrimp, scallops - Avoid or limit high fat processed meats such as sausage and bacon  Dairy - Include moderate amounts of low fat dairy products  - Focus on healthy dairy such as fat free yogurt, skim milk, low or reduced fat cheese - Limit dairy products higher in fat such as whole or 2% milk, cheese, ice cream  Alcohol - Moderate amounts of red wine is ok  - No more than 5 oz daily for women (all ages) and men older than age 81  - No more than 10 oz of wine daily for men younger than 35  Other - Limit sweets and other desserts  - Use herbs and spices instead of salt to flavor foods  - Herbs and spices common to the traditional Mediterranean Diet include: basil, bay leaves, chives, cloves, cumin, fennel, garlic, lavender, marjoram, mint, oregano, parsley, pepper, rosemary, sage, savory, sumac, tarragon, thyme   It's not just a diet, it's a lifestyle:  . The Mediterranean diet includes lifestyle factors typical of those in the region  . Foods, drinks and meals are best eaten with others and savored . Daily physical activity is important for overall good health . This could be strenuous exercise like running and aerobics . This could also be more leisurely activities such as walking, housework, yard-work, or taking the stairs . Moderation is the key; a balanced and healthy diet accommodates most foods and drinks . Consider portion sizes and frequency of consumption of certain foods   Meal Ideas & Options:  . Breakfast:  o Whole wheat toast or whole wheat English muffins with peanut butter & hard boiled egg o Steel cut oats topped with apples & cinnamon and skim milk  o Fresh fruit: banana, strawberries, melon, berries, peaches  o Smoothies: strawberries, bananas, greek yogurt, peanut butter o Low fat greek yogurt with blueberries and granola  o Egg white omelet with spinach and mushrooms o Breakfast couscous: whole wheat couscous, apricots,  skim milk, cranberries  . Sandwiches:  o Hummus and grilled vegetables (peppers, zucchini, squash) on whole wheat bread   o Grilled chicken on whole wheat pita with lettuce, tomatoes, cucumbers or tzatziki  o Tuna salad on whole wheat bread: tuna salad made with greek yogurt, olives, red peppers, capers, green onions o Garlic rosemary lamb pita: lamb sauted with garlic, rosemary, salt & pepper; add lettuce, cucumber, greek yogurt to pita - flavor with lemon juice and black pepper  . Seafood:  o Mediterranean grilled salmon, seasoned with garlic, basil, parsley, lemon juice and black pepper o Shrimp, lemon, and spinach whole-grain pasta salad made with low fat greek yogurt  o Seared scallops with lemon orzo  o Seared tuna steaks seasoned salt, pepper, coriander topped with tomato mixture of olives, tomatoes, olive oil, minced garlic, parsley, green onions and cappers  . Meats:  o Herbed greek chicken salad with kalamata olives, cucumber, feta  o Red bell peppers stuffed with spinach, bulgur, lean ground beef (or lentils) & topped with feta   o Kebabs: skewers of chicken, tomatoes, onions, zucchini, squash  o [place] burgers:  made with red onions, mint, dill, lemon juice, feta cheese topped with roasted red peppers . Vegetarian o Cucumber salad: cucumbers, artichoke hearts, celery, red onion, feta cheese, tossed in olive oil & lemon juice  o Hummus and whole grain pita points with a greek salad (lettuce, tomato, feta, olives, cucumbers, red onion) o Lentil soup with celery, carrots made with vegetable broth, garlic, salt and pepper  o Tabouli salad: parsley, bulgur, mint, scallions, cucumbers, tomato, radishes, lemon juice, olive oil, salt and pepper.

## 2020-01-07 ENCOUNTER — Ambulatory Visit
Admission: RE | Admit: 2020-01-07 | Discharge: 2020-01-07 | Disposition: A | Discharge status: Home / Self Care | Payer: 59 | Source: Ambulatory Visit | Attending: Family Medicine | Admitting: Family Medicine

## 2020-01-07 DIAGNOSIS — E0789 Other specified disorders of thyroid: Secondary | ICD-10-CM

## 2020-01-07 DIAGNOSIS — R221 Localized swelling, mass and lump, neck: Secondary | ICD-10-CM

## 2020-01-30 ENCOUNTER — Other Ambulatory Visit: Payer: Self-pay

## 2020-01-30 ENCOUNTER — Other Ambulatory Visit: Payer: 59

## 2020-01-30 DIAGNOSIS — R221 Localized swelling, mass and lump, neck: Secondary | ICD-10-CM

## 2020-01-30 DIAGNOSIS — Z8349 Family history of other endocrine, nutritional and metabolic diseases: Secondary | ICD-10-CM

## 2020-01-30 DIAGNOSIS — Z7689 Persons encountering health services in other specified circumstances: Secondary | ICD-10-CM

## 2020-01-30 DIAGNOSIS — E0789 Other specified disorders of thyroid: Secondary | ICD-10-CM

## 2020-01-30 DIAGNOSIS — K1379 Other lesions of oral mucosa: Secondary | ICD-10-CM

## 2020-01-30 DIAGNOSIS — Z8759 Personal history of other complications of pregnancy, childbirth and the puerperium: Secondary | ICD-10-CM

## 2020-01-30 DIAGNOSIS — Z8632 Personal history of gestational diabetes: Secondary | ICD-10-CM

## 2020-01-30 DIAGNOSIS — J3489 Other specified disorders of nose and nasal sinuses: Secondary | ICD-10-CM

## 2020-01-30 DIAGNOSIS — Z833 Family history of diabetes mellitus: Secondary | ICD-10-CM

## 2020-01-30 DIAGNOSIS — R030 Elevated blood-pressure reading, without diagnosis of hypertension: Secondary | ICD-10-CM

## 2020-01-31 LAB — CBC WITH DIFFERENTIAL/PLATELET
Basophils Absolute: 0 10*3/uL (ref 0.0–0.2)
Basos: 1 %
EOS (ABSOLUTE): 0.1 10*3/uL (ref 0.0–0.4)
Eos: 3 %
Hematocrit: 40.2 % (ref 34.0–46.6)
Hemoglobin: 13.8 g/dL (ref 11.1–15.9)
Immature Grans (Abs): 0 10*3/uL (ref 0.0–0.1)
Immature Granulocytes: 0 %
Lymphocytes Absolute: 1.8 10*3/uL (ref 0.7–3.1)
Lymphs: 40 %
MCH: 31 pg (ref 26.6–33.0)
MCHC: 34.3 g/dL (ref 31.5–35.7)
MCV: 90 fL (ref 79–97)
Monocytes Absolute: 0.4 10*3/uL (ref 0.1–0.9)
Monocytes: 8 %
Neutrophils Absolute: 2.2 10*3/uL (ref 1.4–7.0)
Neutrophils: 48 %
Platelets: 208 10*3/uL (ref 150–450)
RBC: 4.45 x10E6/uL (ref 3.77–5.28)
RDW: 12.2 % (ref 11.7–15.4)
WBC: 4.6 10*3/uL (ref 3.4–10.8)

## 2020-01-31 LAB — TSH+FREE T4
Free T4: 1.35 ng/dL (ref 0.82–1.77)
TSH: 1.37 u[IU]/mL (ref 0.450–4.500)

## 2020-01-31 LAB — COMPREHENSIVE METABOLIC PANEL
ALT: 17 IU/L (ref 0–32)
AST: 18 IU/L (ref 0–40)
Albumin/Globulin Ratio: 1.9 (ref 1.2–2.2)
Albumin: 4.5 g/dL (ref 3.8–4.8)
Alkaline Phosphatase: 41 IU/L (ref 39–117)
BUN/Creatinine Ratio: 16 (ref 9–23)
BUN: 15 mg/dL (ref 6–20)
Bilirubin Total: 0.6 mg/dL (ref 0.0–1.2)
CO2: 23 mmol/L (ref 20–29)
Calcium: 9.1 mg/dL (ref 8.7–10.2)
Chloride: 104 mmol/L (ref 96–106)
Creatinine, Ser: 0.92 mg/dL (ref 0.57–1.00)
GFR calc Af Amer: 91 mL/min/{1.73_m2} (ref >59)
GFR calc non Af Amer: 79 mL/min/{1.73_m2} (ref >59)
Globulin, Total: 2.4 g/dL (ref 1.5–4.5)
Glucose: 92 mg/dL (ref 65–99)
Potassium: 4.2 mmol/L (ref 3.5–5.2)
Sodium: 139 mmol/L (ref 134–144)
Total Protein: 6.9 g/dL (ref 6.0–8.5)

## 2020-01-31 LAB — LIPID PANEL
Chol/HDL Ratio: 5.1 ratio — ABNORMAL HIGH (ref 0.0–4.4)
Cholesterol, Total: 202 mg/dL — ABNORMAL HIGH (ref 100–199)
HDL: 40 mg/dL (ref >39)
LDL Chol Calc (NIH): 143 mg/dL — ABNORMAL HIGH (ref 0–99)
Triglycerides: 102 mg/dL (ref 0–149)
VLDL Cholesterol Cal: 19 mg/dL (ref 5–40)

## 2020-01-31 LAB — HEMOGLOBIN A1C
Est. average glucose Bld gHb Est-mCnc: 105 mg/dL
Hgb A1c MFr Bld: 5.3 % (ref 4.8–5.6)

## 2020-01-31 LAB — VITAMIN D 25 HYDROXY (VIT D DEFICIENCY, FRACTURES): Vit D, 25-Hydroxy: 19.9 ng/mL — ABNORMAL LOW (ref 30.0–100.0)

## 2020-01-31 LAB — THYROID ANTIBODIES
Thyroglobulin Antibody: <1 IU/mL (ref 0.0–0.9)
Thyroperoxidase Ab SerPl-aCnc: <9 IU/mL (ref 0–34)

## 2020-01-31 LAB — T3: T3, Total: 130 ng/dL (ref 71–180)

## 2020-02-04 ENCOUNTER — Ambulatory Visit (INDEPENDENT_AMBULATORY_CARE_PROVIDER_SITE_OTHER): Payer: 59 | Admitting: Family Medicine

## 2020-02-04 ENCOUNTER — Other Ambulatory Visit: Payer: Self-pay

## 2020-02-04 ENCOUNTER — Encounter: Payer: Self-pay | Admitting: Family Medicine

## 2020-02-04 DIAGNOSIS — Z8349 Family history of other endocrine, nutritional and metabolic diseases: Secondary | ICD-10-CM

## 2020-02-04 DIAGNOSIS — M545 Low back pain, unspecified: Secondary | ICD-10-CM

## 2020-02-04 DIAGNOSIS — G8929 Other chronic pain: Secondary | ICD-10-CM | POA: Insufficient documentation

## 2020-02-04 DIAGNOSIS — Z809 Family history of malignant neoplasm, unspecified: Secondary | ICD-10-CM

## 2020-02-04 DIAGNOSIS — Z7189 Other specified counseling: Secondary | ICD-10-CM

## 2020-02-04 DIAGNOSIS — E7800 Pure hypercholesterolemia, unspecified: Secondary | ICD-10-CM | POA: Insufficient documentation

## 2020-02-04 DIAGNOSIS — E559 Vitamin D deficiency, unspecified: Secondary | ICD-10-CM | POA: Insufficient documentation

## 2020-02-04 DIAGNOSIS — Z833 Family history of diabetes mellitus: Secondary | ICD-10-CM

## 2020-02-04 DIAGNOSIS — Z719 Counseling, unspecified: Secondary | ICD-10-CM

## 2020-02-04 DIAGNOSIS — Z83438 Family history of other disorder of lipoprotein metabolism and other lipidemia: Secondary | ICD-10-CM

## 2020-02-04 DIAGNOSIS — Z8249 Family history of ischemic heart disease and other diseases of the circulatory system: Secondary | ICD-10-CM

## 2020-02-04 NOTE — Patient Instructions (Signed)

## 2020-02-04 NOTE — Progress Notes (Signed)
Telehealth office visit note for Lisa Johnston, D.O- at Primary Care at [facility]   I connected with current patient today and verified that I am speaking with the correct person   . Location of the patient: Home . Location of the provider: Office - This visit type was conducted due to national recommendations for restrictions regarding the COVID-19 Pandemic (e.g. social distancing) in an effort to limit this patient's exposure and mitigate transmission in our community.    - No physical exam could be performed with this format, beyond that communicated to [place] by the patient/ family members as noted.   - Additionally my office staff/ schedulers were to discuss with the patient that there may be a monetary charge related to this service, depending on their medical insurance.  My understanding is that patient understood and consented to proceed.     _________________________________________________________________________________   History of Present Illness:  I, Peggye Fothergill, am serving as Neurosurgeon for [employer].  She is currently monitoring her BP at home.  In the past, it was regularly running 117/73, and now 122/82 consistently.  - Weight Loss Concerns Says she kind of knew what her labs were going to be, but in terms of weight loss, "I needed to see it more black and white to light a flame under my ass."  Notes that her mom is working on weight loss, and using adipex (phentermine) for assistance, and the patient wonders if she should utilize this as well.  She tried Weight Watchers for assistance in the past, but notes "it consumed my entire life."  She got very wrapped up in all the measuring, etc., and one day she just got sick of it.  She wishes to make better choices instead of pre-planning her life in that fashion.  Notes currently, for example, she's been eating out for lunch every day with the girls, and knows she could replace this with a healthier packed lunch as a  good first step toward change.  - Chronic Back Pain Notes she has had back pain ever since the birth of her son.  She worries that this pain is nerve-related, and notes that it improved with weight loss in the past, but once she gained the weight back, the pain resumed.  She now takes 1000 mg of tylenol in the morning, and 800 mg of Advil in the morning every single day.  She's been doing this for almost three years, ever since the birth of her son.  "I definitely noticed a difference when I lost forty lbs."  Sometimes she has radicular pain.  Notes last summer, she was at the beach, and she was holding her son and chasing him from the water one day, and "the next day, I could not even walk."  The pain at that time went to her legs, her toes, and she has not had pain like that again since.  Regarding the quality of the pain, it feels "like my back is exhausted."  Says "standing there is just so achy and exhausted on my back."  For her job, she has to stand in one position for long periods of time every day.  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  Says with the pain, "some days, I would not wish this on my worst enemy."     GAD 7 : Generalized Anxiety Score 02/04/2020 07/11/2017 05/21/2017  Nervous, Anxious, on Edge 0 2 2  Control/stop worrying 1 1 2   Worry too much - different things 1 2 2   Trouble relaxing 1 2 2   Restless 1 0 1  Easily annoyed or irritable 0 1 2  Afraid - awful might happen 0 0 0  Total GAD 7 Score 4 8 11   Anxiety Difficulty Somewhat difficult - -    Depression screen [facility] 2/9 02/04/2020 12/24/2019 07/11/2017 05/21/2017  Decreased Interest 0 1 0 1  Down, Depressed, Hopeless 0 0 0 0  PHQ - 2 Score 0 1 0 1  Altered sleeping 3 1 0 1  Tired, decreased energy 3 1 3 1   Change in appetite 1 1 0 0  Feeling bad or failure about yourself  0 0 0 0  Trouble concentrating 2 2 2  0  Moving slowly or  fidgety/restless 2 1 2  0  Suicidal thoughts 0 0 0 0  PHQ-9 Score 11 7 7 3   Difficult doing work/chores Somewhat difficult Somewhat difficult - -    Recent Results (from the past 2160 hour(s))  Novel Coronavirus, NAA ([facility])     Status: Abnormal   Collection Time: 11/17/19 11:43 AM   Specimen: Nasopharyngeal(NP) swabs in vial transport medium   NASOPHARYNGE  TESTING  Result Value Ref Range   SARS-CoV-2, NAA Detected (A) Not Detected    Comment: This nucleic acid amplification test was developed and its performance characteristics determined by 07/13/2017. Nucleic acid amplification tests include RT-PCR and TMA. This test has not been FDA cleared or approved. This test has been authorized by FDA under an Emergency Use Authorization (EUA). This test is only authorized for the duration of time the declaration that circumstances exist justifying the authorization of the emergency use of in vitro diagnostic tests for detection of SARS-CoV-2 virus and/or diagnosis of COVID-19 infection under section 564(b)(1) of the Act, 21 U.S.C. 05/23/2017) (1), unless the authorization is terminated or revoked sooner. When diagnostic testing is negative, the possibility of a false negative result should be considered in the context of a patient's recent exposures and the presence of clinical signs and symptoms consistent with COVID-19. An individual without symptoms of COVID-19 and who is not shedding SARS-CoV-2 virus wo uld expect to have a negative (not detected) result in this assay.   VITAMIN D 25 Hydroxy (Vit-D Deficiency, Fractures)     Status: Abnormal   Collection Time: 01/30/20  8:30 AM  Result Value Ref Range   Vit D, 25-Hydroxy 19.9 (L) 30.0 - 100.0 ng/mL    Comment: Vitamin D deficiency has been defined by the Institute of Medicine and an Endocrine Society practice guideline as a level of serum 25-OH vitamin D less than 20 ng/mL (1,2). The Endocrine Society went on to further  define vitamin D insufficiency as a level between 21 and 29 ng/mL (2). 1. IOM (Institute of Medicine). 2010. Dietary reference    intakes for calcium and D. [place]: The    . 2. Holick MF, [place], Bischoff-Ferrari HA, et al.    Evaluation, treatment, and prevention of vitamin D    deficiency: an Endocrine Society clinical practice    guideline. JCEM. 2011 Jul; 96(7):1911-30.   Lipid panel     Status: Abnormal   Collection Time: 01/30/20  8:30 AM  Result Value Ref Range   Cholesterol, Total 202 (H) 100 - 199 mg/dL   Triglycerides 11/19/19 0 - 149 mg/dL   HDL 40 [employer] mg/dL  VLDL Cholesterol Cal 19 5 - 40 mg/dL   LDL Chol Calc (NIH) 143 (H) 0 - 99 mg/dL   Chol/HDL Ratio 5.1 (H) 0.0 - 4.4 ratio    Comment:                                   T. Chol/HDL Ratio                                             Men  Women                               1/2 Avg.Risk  3.4    3.3                                   Avg.Risk  5.0    4.4                                2X Avg.Risk  9.6    7.1                                3X Avg.Risk 23.4   11.0   Hemoglobin A1c     Status: None   Collection Time: 01/30/20  8:30 AM  Result Value Ref Range   Hgb A1c MFr Bld 5.3 4.8 - 5.6 %    Comment:          Prediabetes: 5.7 - 6.4          Diabetes: >6.4          Glycemic control for adults with diabetes: <7.0    Est. average glucose Bld gHb Est-mCnc 105 mg/dL  Comprehensive metabolic panel     Status: None   Collection Time: 01/30/20  8:30 AM  Result Value Ref Range   Glucose 92 65 - 99 mg/dL   BUN 15 6 - 20 mg/dL   Creatinine, Ser 0.92 0.57 - 1.00 mg/dL   GFR calc non Af Amer 79 >59 mL/min/1.73   GFR calc Af Amer 91 >59 mL/min/1.73   BUN/Creatinine Ratio 16 9 - 23   Sodium 139 134 - 144 mmol/L   Potassium 4.2 3.5 - 5.2 mmol/L   Chloride 104 96 - 106 mmol/L   CO2 23 20 - 29 mmol/L   Calcium 9.1 8.7 - 10.2 mg/dL   Total Protein 6.9 6.0 - 8.5 g/dL   Albumin 4.5 3.8 - 4.8 g/dL     Globulin, Total 2.4 1.5 - 4.5 g/dL   Albumin/Globulin Ratio 1.9 1.2 - 2.2   Bilirubin Total 0.6 0.0 - 1.2 mg/dL   Alkaline Phosphatase 41 39 - 117 IU/L   AST 18 0 - 40 IU/L   ALT 17 0 - 32 IU/L  CBC with Differential/Platelet     Status: None   Collection Time: 01/30/20  8:30 AM  Result Value Ref Range   WBC 4.6 3.4 - 10.8 x10E3/uL   RBC 4.45 3.77 - 5.28 x10E6/uL   Hemoglobin 13.8 11.1 - 15.9 g/dL   Hematocrit 40.2 34.0 -  46.6 %   MCV 90 79 - 97 fL   MCH 31.0 26.6 - 33.0 pg   MCHC 34.3 31.5 - 35.7 g/dL   RDW 78.5 88.5 - 02.7 %   Platelets 208 150 - 450 x10E3/uL   Neutrophils 48 Not Estab. %   Lymphs 40 Not Estab. %   Monocytes 8 Not Estab. %   Eos 3 Not Estab. %   Basos 1 Not Estab. %   Neutrophils Absolute 2.2 1.4 - 7.0 x10E3/uL   Lymphocytes Absolute 1.8 0.7 - 3.1 x10E3/uL   Monocytes Absolute 0.4 0.1 - 0.9 x10E3/uL   EOS (ABSOLUTE) 0.1 0.0 - 0.4 x10E3/uL   Basophils Absolute 0.0 0.0 - 0.2 x10E3/uL   Immature Granulocytes 0 Not Estab. %   Immature Grans (Abs) 0.0 0.0 - 0.1 x10E3/uL  T3     Status: None   Collection Time: 01/30/20  8:30 AM  Result Value Ref Range   T3, Total 130 71 - 180 ng/dL  TSH + free T4     Status: None   Collection Time: 01/30/20  8:30 AM  Result Value Ref Range   TSH 1.370 0.450 - 4.500 uIU/mL   Free T4 1.35 0.82 - 1.77 ng/dL  Thyroid antibodies     Status: None   Collection Time: 01/30/20  8:30 AM  Result Value Ref Range   Thyroperoxidase Ab SerPl-aCnc <9 0 - 34 IU/mL   Thyroglobulin Antibody <1.0 0.0 - 0.9 IU/mL    Comment: Thyroglobulin Antibody measured by Beckman Coulter Methodology     Impression and Recommendations:     1. Morbid obesity (HCC)   2. Pure hypercholesterolemia   3. Vitamin D deficiency   4. Health education/counseling   5. Chronic low back pain without sciatica, unspecified back pain laterality- ever since she was pregnant   6. FHx: early coronary artery disease   7. Family history of type 1 diabetes mellitus    8. Family history of thyroid disease   9. Family history of cancer- prostate and throat   10. Family history of hypertension in mother   18. Family history of combined hyperlipidemia      History of pregnancy induced hypertension - BP stable at this time.  - Advised patient to continue to keep an eye on her blood pressure and monitor it roughly once per week for preventative purposes.  - Will continue to monitor.   Chronic Low Back Pain w/out sciatica, ever since she was pregnant (3 years ago) - Advised patient to work on decreasing her intake of Advil and tylenol. - For relief of chronic back pain, STRONGLY encouraged patient to seek physical therapy.  - If and when she is ready, advised patient to request a referral to a [facility].  - Strongly advised patient to engage in prudent PT exercises to help alleviate back pain.  - Will continue to monitor.   Lab Review - Reviewed recent lab work (01/30/2020) in depth with patient today.  All lab work within normal limits unless otherwise noted.  Extensive education provided and all questions answered.  - To preserve organ health, especially kidney and liver health, advised patient to avoid use of nephrotoxic and hepatotoxic substances, continue to hydrate adequately, engage in regular physical activity (especially a formal exercise routine), and maintain control of blood sugar and blood pressure.   Vitamin D Deficiency - 19.9, low last check five days ago.  - Begin 5000 IU's of Vitamin D3 daily.  See med list.  -  Will continue to monitor and re-check in 3-4 months.   Hyperlipidemia - family history of early CAD - Last FLP obtained 5 days ago:  Triglycerides = 102, WNL. HDL = 40, WNL. LDL = 143, elevated.  - Discussed that patient's LDL is elevated at this time.  - Encouraged patient to engage in dietary changes such as low saturated & trans fat diets for hyperlipidemia and low carb diets to  prevent hypertriglyceridemia.  - To help increase HDL and reduce LDL, encouraged patient to follow AHA guidelines for regular exercise and also engage in weight loss if BMI above 25.   - Educational handouts provided at patient's desire and/ or told to look online at the American Heart Association website for further information.  - We will continue to monitor and re-check as discussed. - If patient desires to work on intensive lifestyle changes, re-check in 3-4 months.   BMI Counseling - Body mass index is 35.59 kg/m, Morbid Obesity Explained to patient what BMI refers to, and what it means medically.    Told patient to think about it as a "medical risk stratification measurement" and how increasing BMI is associated with increasing risk/ or worsening state of various diseases such as hypertension, hyperlipidemia, diabetes, premature OA, depression etc.  American Heart Association guidelines for healthy diet, basically Mediterranean diet, and exercise guidelines of 30 minutes 5 days per week or more discussed in detail.  - Extensive counseling provided.  Encouraged patient to engage in mindfulness regarding her eating habits, looking into resources such as self-help books and meditation to critically examine her relationship with food.  - Recommended Weight Watchers, LoseIt, MyFitnessPlan.  - If patient desires a referral to the Healthy Weight and Wellness program through [facility], discussed that this option is available to her.  - Health counseling performed.  All questions answered.   Heath Education/Counseling & Preventative Maintenance - Advised patient to continue working toward exercising to improve overall mental, physical, and emotional health.    - Reviewed the "spokes of the wheel" of mood and wellbeing.  Stressed the importance of ongoing prudent habits, including regular exercise, appropriate sleep hygiene, healthful dietary habits, and prayer/meditation to relax.  - Encouraged  patient to engage in daily physical activity, especially a formal exercise routine.  Recommended that the patient eventually strive for at least 150 minutes of moderate cardiovascular activity per week according to guidelines established by the [facility].   - Healthy dietary habits encouraged, including low-carb, and high amounts of lean protein in diet.   - Patient should also consume adequate amounts of water.   - As part of my medical decision making, I reviewed the following data within the electronic MEDICAL RECORD NUMBER History obtained from pt /family, CMA notes reviewed and incorporated if applicable, Labs reviewed, Radiograph/ tests reviewed if applicable and OV notes from prior OV's with me, as well as other specialists she/he has seen since seeing me last, were all reviewed and used in my medical decision making process today.    - Additionally, when appropriate, discussion had with patient regarding our treatment plan, and their biases/concerns about that plan were used in my medical decision making today.    - The patient agreed with the plan and demonstrated an understanding of the instructions.   No barriers to understanding were identified.     - The patient was advised to call back or seek an in-person evaluation if the symptoms worsen or if the condition fails to improve as anticipated.  Return for re-check of Vitamin D and FLP in 6 months after intensive lifestyle changes, with OV 2-3 days later.    Time spent on visit including pre-visit chart review and post-visit care was 35 minutes.   Note:  This note was prepared with assistance of Dragon voice recognition software. Occasional wrong-word or sound-a-like substitutions may have occurred due to the inherent limitations of voice recognition software.  The 21st Century Cures Act was signed into law in 2016 which includes the topic of electronic health records.  This provides immediate access to information in MyChart.  This includes  consultation notes, operative notes, office notes, lab results and pathology reports.  If you have any questions about what you read please let us know at your next visit or call us at the office.  We are right here with you.  This document serves as a record of services personally performed by Lisa Loteborah Shakedra Beam, DO. It was created on her behalf by Peggye FothergillKatherine Galloway, a trained medical scribe. The creation of this record is based on the scribe's personal observations and the provider's statements to them.    The above documentation from Peggye FothergillKatherine Galloway, medical scribe, has been reviewed by Carlye Grippeeborah J. Alainna Stawicki, D.O.    __________________________________________________________________________________     Patient Care Team    Relationship Specialty Notifications Start End  Lisa Lotpalski, Hesham Womac, DO PCP - General Family Medicine  12/24/19      -Vitals obtained; medications/ allergies reconciled;  personal medical, social, Sx etc.histories were updated by CMA, reviewed by me and are reflected in chart   Patient Active Problem List   Diagnosis Date Noted  . Morbid obesity (HCC) 12/24/2019  . History of gestational diabetes 04/12/2017  . Pregnancy induced hypertension   . Pre-eclampsia, severe 05/27/2017  . Reoccurring nasal sores 12/24/2019  . Reoccurring mouth sores-   will obtain future biopsy by her oral surgeon 12/24/2019  . Vitamin D deficiency 02/04/2020  . Chronic low back pain without sciatica 02/04/2020  . Pure hypercholesterolemia 02/04/2020  . Fullness of neck 12/24/2019  . Thyroid fullness 12/24/2019  . FHx: early coronary artery disease 12/24/2019  . Family history of type 1 diabetes mellitus 12/24/2019  . Family history of thyroid disease 12/24/2019  . Family history of hypertension in mother 12/24/2019  . Family history of combined hyperlipidemia 12/24/2019  . Family history of cancer- prostate and throat 12/24/2019  . Attention deficit hyperactivity disorder (ADHD)  12/24/2019  . History of OCD (obsessive compulsive disorder) 12/24/2019  . History of pregnancy induced hypertension 12/24/2019  . History of gestational diabetes 12/24/2019  . IUD (intrauterine device) in place 12/24/2019  . AMA (advanced maternal age) multigravida 35+ 04/16/2017     Current Meds  Medication Sig  . acetaminophen (TYLENOL) 500 MG tablet Take 500 mg by mouth every 6 (six) hours as needed (taking 2 daily).  . cetirizine (ZYRTEC) 10 MG chewable tablet Chew 10 mg by mouth daily.  Marland Kitchen. ibuprofen (ADVIL,MOTRIN) 600 MG tablet Take 1 tablet (600 mg total) by mouth every 6 (six) hours. (Patient taking differently: Take 600 mg by mouth every 6 (six) hours. 600-800mg  daily)     Allergies:  No Known Allergies   ROS:  See above HPI for pertinent positives and negatives   Objective:   Height 5\' 9"  (1.753 m), weight 241 lb (109.3 kg), last menstrual period 01/14/2020.  (if some vitals are omitted, this means that patient was UNABLE to obtain them even though they were asked to get them prior to  OV today.  They were asked to call us at their earliest convenience with these once obtained. ) General: A & O * 3; sounds in no acute distress; in usual state of health.  Skin: Pt confirms warm and dry extremities and pink fingertips HEENT: Pt confirms lips non-cyanotic Chest: Patient confirms normal chest excursion and movement Respiratory: speaking in full sentences, no conversational dyspnea; patient confirms no use of accessory muscles Psych: insight appears good, mood- appears full

## 2020-06-07 ENCOUNTER — Ambulatory Visit
Admission: EM | Admit: 2020-06-07 | Discharge: 2020-06-07 | Disposition: A | Discharge status: Home / Self Care | Payer: 59 | Attending: Physician Assistant | Admitting: Physician Assistant

## 2020-06-07 ENCOUNTER — Other Ambulatory Visit: Payer: Self-pay

## 2020-06-07 DIAGNOSIS — Z20822 Contact with and (suspected) exposure to covid-19: Secondary | ICD-10-CM

## 2020-06-07 DIAGNOSIS — R0981 Nasal congestion: Secondary | ICD-10-CM

## 2020-06-07 DIAGNOSIS — Z1152 Encounter for screening for COVID-19: Secondary | ICD-10-CM

## 2020-06-07 DIAGNOSIS — J029 Acute pharyngitis, unspecified: Secondary | ICD-10-CM

## 2020-06-07 NOTE — Discharge Instructions (Addendum)
COVID PCR testing ordered. I would like you to quarantine until testing results. You can take over the counter flonase/nasacort to help with nasal congestion/drainage. Tylenol/motrin for pain and fever. Keep hydrated, urine should be clear to pale yellow in color. If experiencing shortness of breath, trouble breathing, go to the emergency department for further evaluation needed.   For sore throat/cough try using a honey-based tea. Use 3 teaspoons of honey with juice squeezed from half lemon. Place shaved pieces of ginger into 1/2-1 cup of water and warm over stove top. Then mix the ingredients and repeat every 4 hours as needed. 

## 2020-06-07 NOTE — ED Provider Notes (Signed)
[facility]    CSN: [id number] Arrival date & time: 06/07/20  0940      History   Chief Complaint Chief Complaint  Patient presents with  . Nasal Congestion    HPI Lisa Johnston is a 39 y.o. female.   39 year old female comes in for 4 day of URI symptoms. Nasal congestion, headache, sore throat, fatigue. Denies fever, chills, body aches. Denies abdominal pain, nausea, vomiting, diarrhea. Denies shortness of breath, loss of taste/smell. Positive COVID 10/2019, fully vaccinated. Positive COVID exposure.      Past Medical History:  Diagnosis Date  . Pregnancy induced hypertension   . Preterm labor     Patient Active Problem List   Diagnosis Date Noted  . Vitamin D deficiency 02/04/2020  . Chronic low back pain without sciatica 02/04/2020  . Pure hypercholesterolemia 02/04/2020  . Fullness of neck 12/24/2019  . Thyroid fullness 12/24/2019  . Morbid obesity (HCC) 12/24/2019  . Reoccurring nasal sores 12/24/2019  . Reoccurring mouth sores-   will obtain future biopsy by her oral surgeon 12/24/2019  . FHx: early coronary artery disease 12/24/2019  . Family history of type 1 diabetes mellitus 12/24/2019  . Family history of thyroid disease 12/24/2019  . Family history of hypertension in mother 12/24/2019  . Family history of combined hyperlipidemia 12/24/2019  . Family history of cancer- prostate and throat 12/24/2019  . Attention deficit hyperactivity disorder (ADHD) 12/24/2019  . History of OCD (obsessive compulsive disorder) 12/24/2019  . History of pregnancy induced hypertension 12/24/2019  . History of gestational diabetes 12/24/2019  . IUD (intrauterine device) in place 12/24/2019  . Pregnancy induced hypertension   . Pre-eclampsia, severe 05/27/2017  . AMA (advanced maternal age) multigravida 35+ 04/16/2017  . History of gestational diabetes 04/12/2017    Past Surgical History:  Procedure Laterality Date  . NO PAST SURGERIES      OB History     Gravida  3   Para  2   Term  1   Preterm  1   AB  1   Living  2     SAB      TAB  1   Ectopic      Multiple  0   Live Births  2            Home Medications    Prior to Admission medications   Medication Sig Start Date End Date Taking? Authorizing Provider  acetaminophen (TYLENOL) 500 MG tablet Take 500 mg by mouth every 6 (six) hours as needed (taking 2 daily).    [provider]  cetirizine (ZYRTEC) 10 MG chewable tablet Chew 10 mg by mouth daily.    [provider]  ibuprofen (ADVIL,MOTRIN) 600 MG tablet Take 1 tablet (600 mg total) by mouth every 6 (six) hours. Patient taking differently: Take 600 mg by mouth every 6 (six) hours. 600-800mg  daily 05/30/17   Hermina Staggers, MD    Family History Family History  Problem Relation Age of Onset  . Cancer Father   . Heart disease Father   . Stroke Father   . Diabetes Daughter   . Hyperlipidemia Daughter   . Cancer Maternal Grandmother   . Diabetes Mother   . Hyperlipidemia Mother   . Hypertension Mother     Social History Social History   Tobacco Use  . Smoking status: Never Smoker  . Smokeless tobacco: Never Used  Substance Use Topics  . Alcohol use: Yes    Comment:  occ glass of wine  . Drug use: No     Allergies   Patient has no known allergies.   Review of Systems Review of Systems  Reason unable to perform ROS: See HPI as above.     Physical Exam Triage Vital Signs ED Triage Vitals  Enc Vitals Group     BP 06/07/20 1009 129/86     Pulse Rate 06/07/20 1009 75     Resp 06/07/20 1009 18     Temp 06/07/20 1009 98 F (36.7 C)     Temp Source 06/07/20 1009 Oral     SpO2 06/07/20 1009 97 %     Weight --      Height --      Head Circumference --      Peak Flow --      Pain Score 06/07/20 1020 0     Pain Loc --      Pain Edu? --      Excl. in GC? --    No data found.  Updated Vital Signs BP 129/86 (BP Location: Right Arm)   Pulse 75   Temp 98 F (36.7 C)  (Oral)   Resp 18   LMP 06/07/2020   SpO2 97%   Physical Exam Constitutional:      General: She is not in acute distress.    Appearance: Normal appearance. She is not ill-appearing, toxic-appearing or diaphoretic.  HENT:     Head: Normocephalic and atraumatic.     Mouth/Throat:     Mouth: Mucous membranes are moist.     Pharynx: Oropharynx is clear. Uvula midline.  Cardiovascular:     Rate and Rhythm: Normal rate and regular rhythm.     Heart sounds: Normal heart sounds. No murmur heard.  No friction rub. No gallop.   Pulmonary:     Effort: Pulmonary effort is normal. No accessory muscle usage, prolonged expiration, respiratory distress or retractions.     Comments: Lungs clear to auscultation without adventitious lung sounds. Musculoskeletal:     Cervical back: Normal range of motion and neck supple.  Neurological:     General: No focal deficit present.     Mental Status: She is alert and oriented to person, place, and time.      UC Treatments / Results  Labs (all labs ordered are listed, but only abnormal results are displayed) Labs Reviewed  NOVEL CORONAVIRUS, NAA    EKG   Radiology No results found.  Procedures Procedures (including critical care time)  Medications Ordered in UC Medications - No data to display  Initial Impression / Assessment and Plan / UC Course  I have reviewed the triage vital signs and the nursing notes.  Pertinent labs & imaging results that were available during my care of the patient were reviewed by me and considered in my medical decision making (see chart for details).    COVID PCR test ordered. Patient to quarantine until testing results return. No alarming signs on exam.  Patient speaking in full sentences without respiratory distress.  Symptomatic treatment discussed.  Push fluids.  Return precautions given.  Patient expresses understanding and agrees to plan.  Final Clinical Impressions(s) / UC Diagnoses   Final diagnoses:    Encounter for screening for COVID-19  Exposure to COVID-19 virus  Nasal congestion  Sore throat    ED Prescriptions    None     PDMP not reviewed this encounter.   Belinda Fisher, PA-C 06/07/20 1056

## 2020-06-07 NOTE — ED Triage Notes (Signed)
Pt states positive covid exposure on Friday. States nasal congestion, headache, sore throat, and fatigue since Thursday. States covid positive 01/21 and sx's feel the same but not as bad. States fully covid vaccine 02/21.

## 2020-06-08 LAB — NOVEL CORONAVIRUS, NAA: SARS-CoV-2, NAA: NOT DETECTED

## 2020-06-08 LAB — SARS-COV-2, NAA 2 DAY TAT

## 2020-09-09 ENCOUNTER — Encounter: Payer: Self-pay | Admitting: Physician Assistant

## 2020-12-03 IMAGING — US US THYROID
1 series · 13 of 25 positions shown · non-contrast
Comparison: None.

CLINICAL DATA: Palpable abnormality.  Neck fullness.

EXAM:
THYROID ULTRASOUND
TECHNIQUE: Ultrasound examination of the thyroid gland and adjacent soft
tissues was performed.

[Series 1: us thyroid · 0.08mm/px · 13 of 44 slices shown]
[im 1/44]
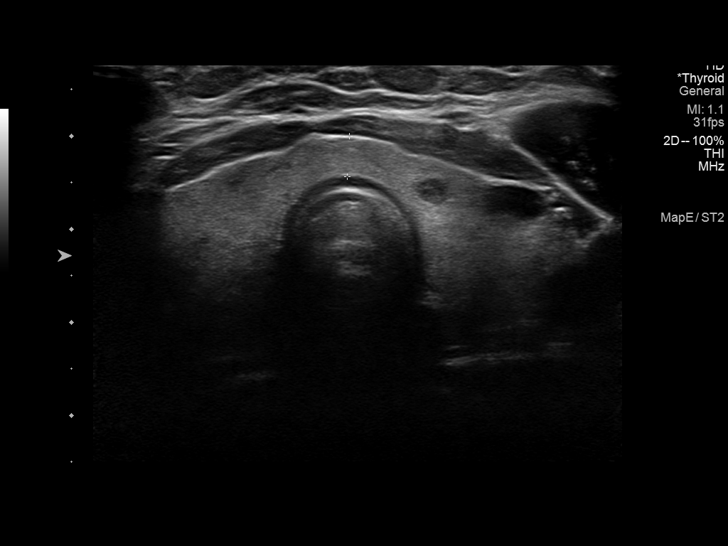
[im 4/44]
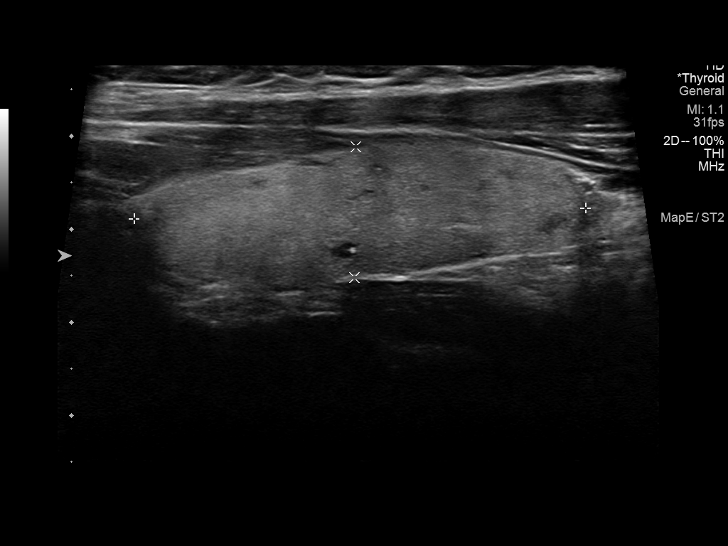
[im 8/44]
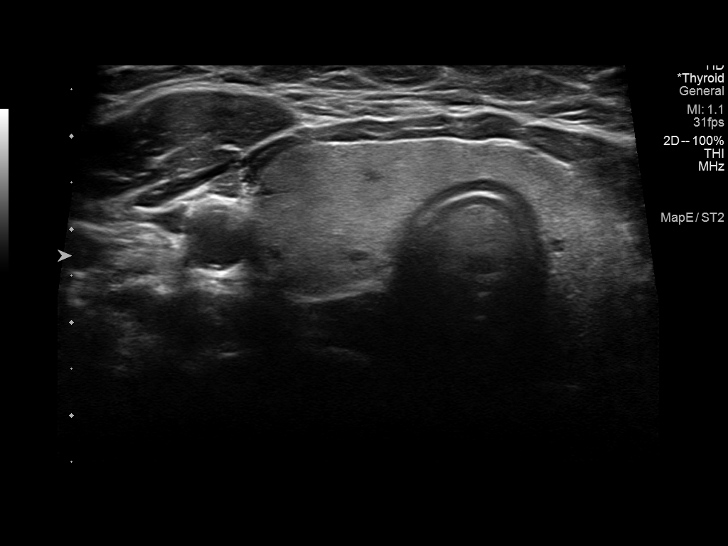
[im 11/44]
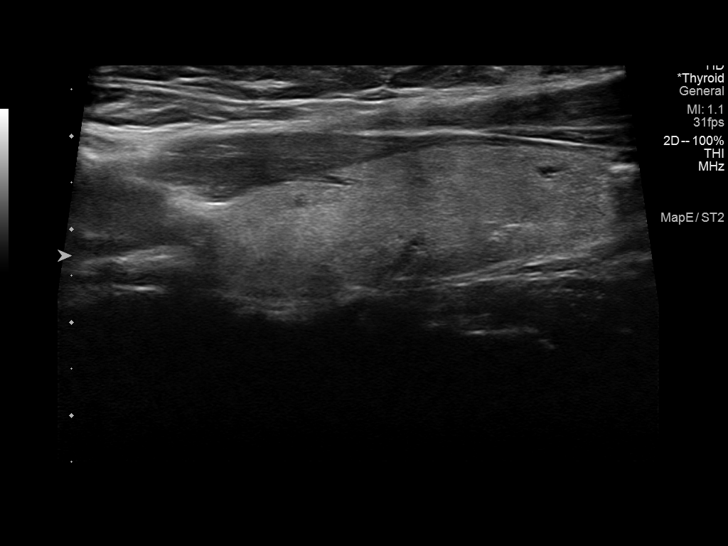
[im 15/44]
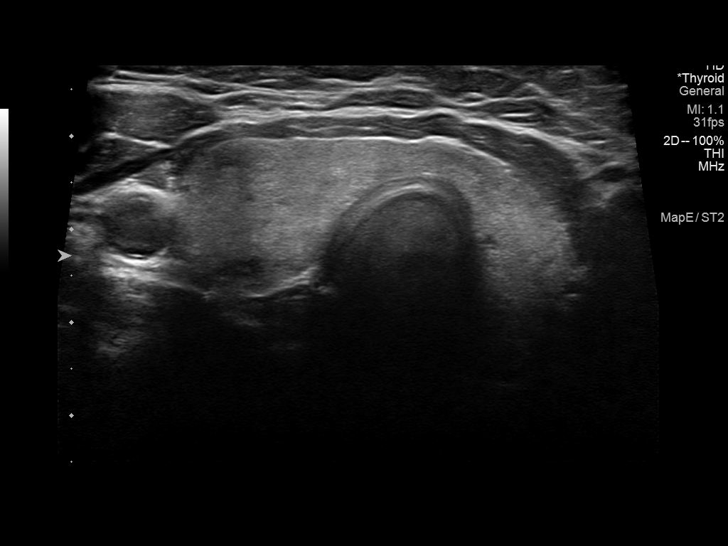
[im 18/44]
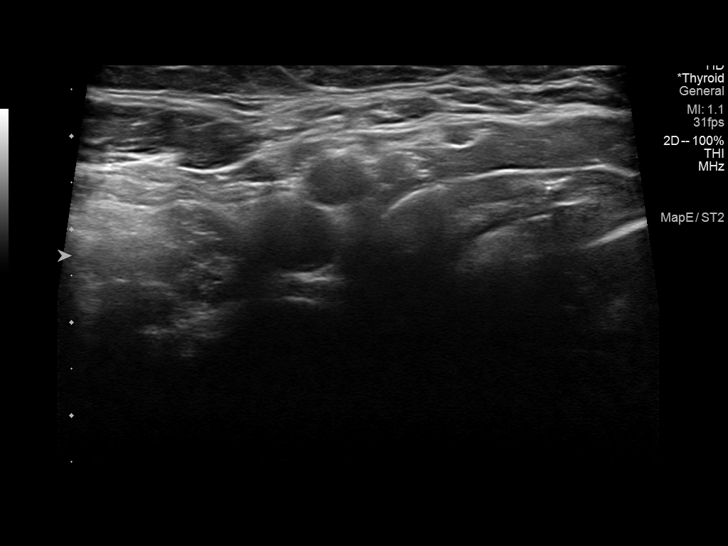
[im 22/44]
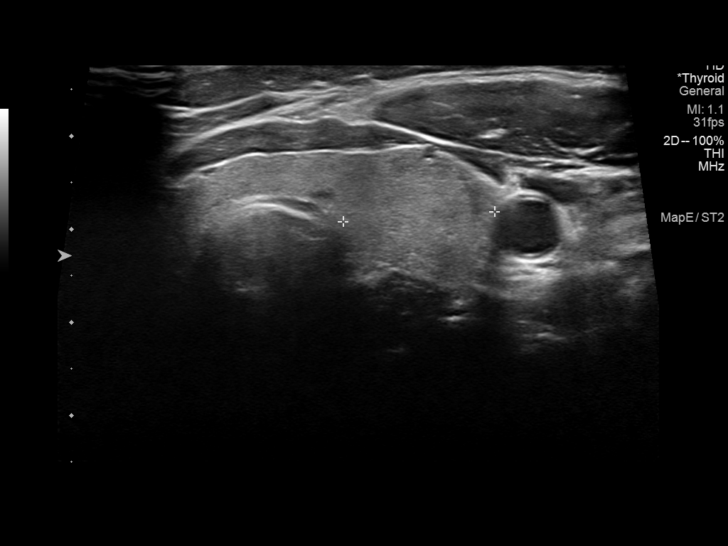
[im 26/44]
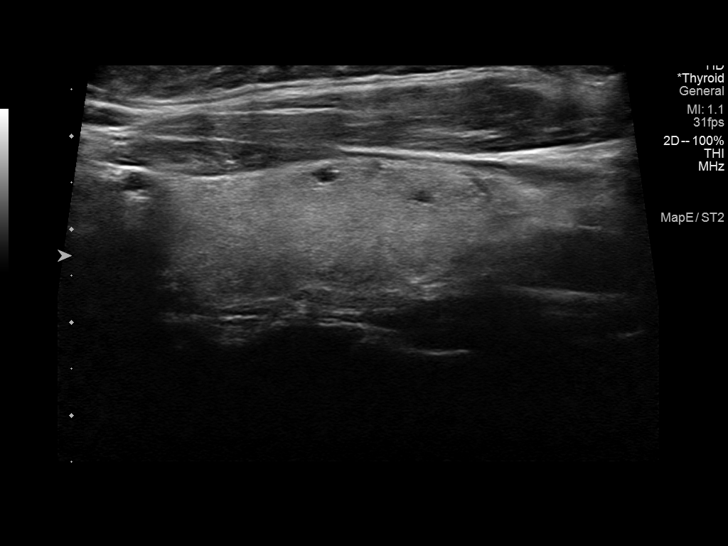
[im 29/44]
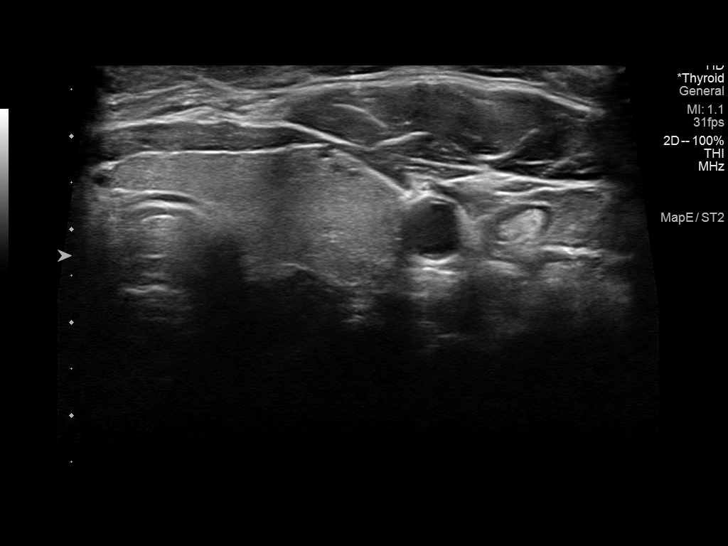
[im 33/44]
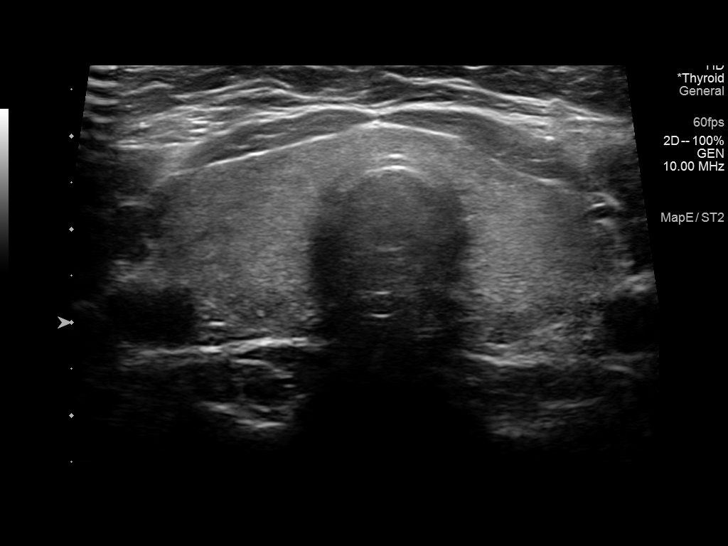
[im 36/44]
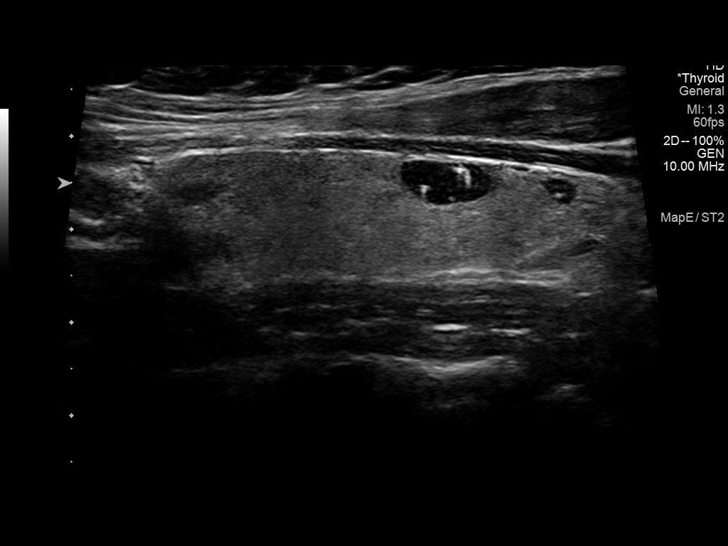
[im 40/44]
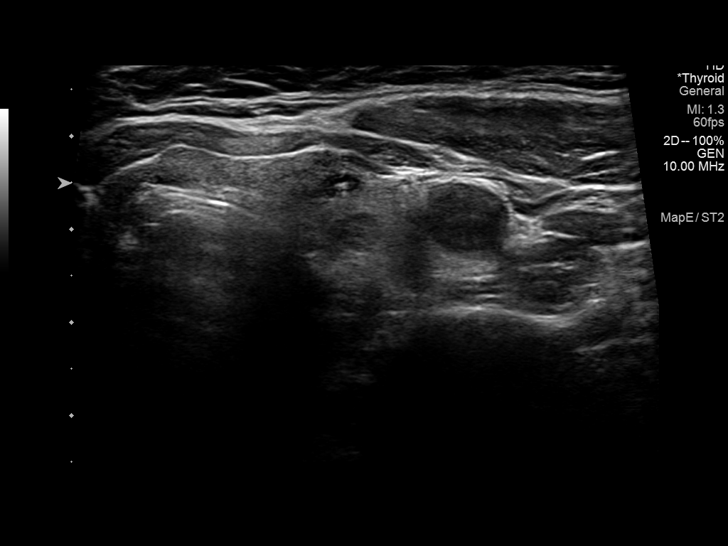
[im 44/44]
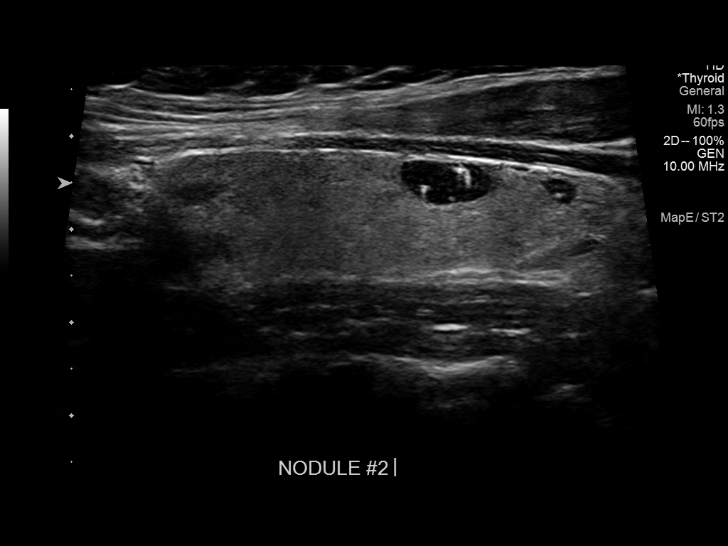

[13 of 25 positions shown; findings below may reference images not displayed]

FINDINGS: Parenchymal Echotexture: Mildly heterogenous

Isthmus: Normal in size measures 0.4 cm in diameter

Right lobe: Normal in size measuring 4.8 x 1.4 x 1.7 cm

Left lobe: Normal in size measuring 5.0 x 1.4 x 1.6 cm

_________________________________________________________

Estimated total number of nodules >/= 1 cm: 1

Number of spongiform nodules >/=  2 cm not described below (TR1): 0

Number of mixed cystic and solid nodules >/= 1.5 cm not described
below (TR2): 0

_________________________________________________________

There is an approximately 0.6 x 0.6 x 0.4 cm hypoechoic nodule
within the superior pole the left lobe of the thyroid (labeled 1),
which does not meet criteria to recommend percutaneous sampling or
continued dedicated follow-up.

There is an approximately 1.0 x 0.8 x 0.5 cm anechoic cyst within
the mid aspect the left lobe of the thyroid (labeled 2), which
contains multiple internal echogenic foci ring down artifact
compatible with benign colloid and does not meet imaging criteria to
recommend percutaneous sampling or continued dedicated follow-up.

There are several additional scattered bilateral punctate (sub 3 mm)
hypoechoic nodules and anechoic cysts scattered within the isthmus,
right and left lobes of the thyroid, many of which contain internal
echogenic foci ring down artifact compatible with benign colloid and
none of which meet imaging criteria to recommend percutaneous
sampling or continued dedicated follow-up.
IMPRESSION: 1. Findings suggestive of multinodular goiter.
2. None of the discretely measured thyroid nodules/cysts, many of
which contain benign colloid, meet imaging criteria to recommend
percutaneous sampling or continued dedicated follow-up.

The above is in keeping with the ACR TI-RADS recommendations - [HOSPITAL] 8624;[DATE].
